# Patient Record
Sex: Female | Born: 1960
Health system: Southern US, Community
[De-identification: ages and names within clinical notes are randomized; demographics above are authoritative.]

## PROBLEM LIST (undated history)

## (undated) DIAGNOSIS — R51 Headache: Secondary | ICD-10-CM

## (undated) DIAGNOSIS — N390 Urinary tract infection, site not specified: Secondary | ICD-10-CM

## (undated) DIAGNOSIS — T7840XA Allergy, unspecified, initial encounter: Secondary | ICD-10-CM

## (undated) DIAGNOSIS — R519 Headache, unspecified: Secondary | ICD-10-CM

## (undated) DIAGNOSIS — B019 Varicella without complication: Secondary | ICD-10-CM

## (undated) DIAGNOSIS — G43909 Migraine, unspecified, not intractable, without status migrainosus: Secondary | ICD-10-CM

## (undated) HISTORY — DX: Varicella without complication: B01.9

## (undated) HISTORY — DX: Headache: R51

## (undated) HISTORY — DX: Headache, unspecified: R51.9

## (undated) HISTORY — DX: Urinary tract infection, site not specified: N39.0

## (undated) HISTORY — DX: Migraine, unspecified, not intractable, without status migrainosus: G43.909

## (undated) HISTORY — DX: Allergy, unspecified, initial encounter: T78.40XA

## (undated) NOTE — Telephone Encounter (Signed)
 Formatting of this note might be different from the original. Please call them and let them know we can do the virtual visit if they are in [place]  tomorrow. If they'd prefer a visit on 7/10 I would be able to add on a virtual visit as long as they are in state on that day. Electronically signed by Lamarr Olam HERO., CGC at 06/30/2022  2:19 PM CDT

## (undated) NOTE — Telephone Encounter (Signed)
 Formatting of this note might be different from the original. Caller would like to talk to Olam fearing or clinical  regarding appointment   Best time to call if possible: anytime   Notes: Patient son is returning call. Someone left him a message about offering appointment now until July 10th appointment for Dr Lamarr. Patient son wants know if that still stands or if they need to try and get on the next fly to [place] for appointment tomorrow. Patient son states its not going to be a guarantee that they can get on the flight tonight. Please return call to patient's son as soon as possible. Flight is tonight otherwise tomorrow morning but they will end up missing the appointment. They do prefer moving the appointment if possible to make things easier for patient. Otherwise if not, they will do what they can to make it to the appointment tomorrow.  Please assist.    Electronically signed by Diona Barry at 06/30/2022  2:12 PM CDT Electronically signed by Diona Barry at 06/30/2022  2:16 PM CDT

---
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
## 1983-01-12 HISTORY — PX: BREAST SURGERY: SHX581

## 1997-11-13 ENCOUNTER — Other Ambulatory Visit: Admission: RE | Admit: 1997-11-13 | Discharge: 1997-11-13 | Payer: Self-pay | Admitting: Obstetrics and Gynecology

## 1998-05-22 ENCOUNTER — Other Ambulatory Visit: Admission: RE | Admit: 1998-05-22 | Discharge: 1998-05-22 | Payer: Self-pay | Admitting: Obstetrics and Gynecology

## 1998-09-09 ENCOUNTER — Inpatient Hospital Stay (HOSPITAL_COMMUNITY): Admission: AD | Admit: 1998-09-09 | Discharge: 1998-09-09 | Payer: Self-pay | Admitting: *Deleted

## 1998-09-11 ENCOUNTER — Inpatient Hospital Stay (HOSPITAL_COMMUNITY): Admission: AD | Admit: 1998-09-11 | Discharge: 1998-09-11 | Payer: Self-pay | Admitting: Obstetrics & Gynecology

## 1998-12-10 ENCOUNTER — Inpatient Hospital Stay (HOSPITAL_COMMUNITY): Admission: AD | Admit: 1998-12-10 | Discharge: 1998-12-12 | Payer: Self-pay | Admitting: Orthopedic Surgery

## 1998-12-14 ENCOUNTER — Inpatient Hospital Stay (HOSPITAL_COMMUNITY): Admission: AD | Admit: 1998-12-14 | Discharge: 1998-12-16 | Payer: Self-pay | Admitting: Obstetrics & Gynecology

## 1998-12-17 ENCOUNTER — Encounter: Admission: RE | Admit: 1998-12-17 | Discharge: 1998-12-17 | Payer: Self-pay | Admitting: Obstetrics & Gynecology

## 1998-12-17 ENCOUNTER — Encounter: Payer: Self-pay | Admitting: Obstetrics & Gynecology

## 1999-01-16 ENCOUNTER — Other Ambulatory Visit: Admission: RE | Admit: 1999-01-16 | Discharge: 1999-01-16 | Payer: Self-pay | Admitting: *Deleted

## 1999-03-20 ENCOUNTER — Ambulatory Visit (HOSPITAL_COMMUNITY): Admission: RE | Admit: 1999-03-20 | Discharge: 1999-03-20 | Payer: Self-pay | Admitting: *Deleted

## 2000-02-16 ENCOUNTER — Other Ambulatory Visit: Admission: RE | Admit: 2000-02-16 | Discharge: 2000-02-16 | Payer: Self-pay | Admitting: *Deleted

## 2008-10-21 ENCOUNTER — Other Ambulatory Visit: Admission: RE | Admit: 2008-10-21 | Discharge: 2008-10-21 | Payer: Self-pay | Admitting: Internal Medicine

## 2008-12-31 ENCOUNTER — Encounter: Admission: RE | Admit: 2008-12-31 | Discharge: 2008-12-31 | Payer: Self-pay | Admitting: Internal Medicine
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
## 2010-05-29 NOTE — Op Note (Signed)
[facility]  Patient:    Deanna [name], Deanna [name]                         MRN: [id number] Proc. Date: 03/20/99 Adm. Date:  [id number] Disc. Date: [id number] Attending:  Ardeen Fillers                           Operative Report  PREOPERATIVE DIAGNOSIS:       Request for sterilization.  Multiparity.  POSTOPERATIVE DIAGNOSIS:      Request for sterilization.  Multiparity.  OPERATION:                    Laparoscopic bilateral tubal ligation.  SURGEON:                      Sung Amabile. Roslyn Smiling, M.D.  ASSISTANT:  ANESTHESIA:                   General anesthesia, endotracheal tube intubation.  ESTIMATED BLOOD LOSS:         Nil.  TUBES AND DRAINS:             None.  COMPLICATIONS:                None.  FINDINGS:                     Normal appearing uterus, tubes, and ovaries. Upper abdomen and appendix normal in appearance.  SPECIMENS:                    None.  INDICATIONS:                  A 50 year old woman, gravida 3, para 3, who requests sterilization.  She has been counseled regarding the benefits, risks, and options including vasectomy, and of the procedure.  She understands the risks of ectopic and failure (1 in 200 to 1 in 300).  DESCRIPTION OF PROCEDURE:     After the establishment of general anesthesia, the patient was placed in the dorsal lithotomy position.  The abdomen and perineum nd vagina were prepped with Betadine solution.  Graves speculum was inserted in the vagina.  The anterior cervical lip was grasped with a single tooth tenaculum. Hulka tenaculum was passed easily through the endocervical canal and attached to the cervix.  Speculum was removed.  The bladder was evacuated with straight catheterization.  The patient was draped.  A vertical subumbilical incision was made on the skin with the knife.  Veress needle was passed through this incision while the lower abdomen was elevated. Placement in the abdominal cavity was ascertained  by dropping the pressure. CO2 was insufflated.  After adequate pneumoperitoneum was established (3.5 liters), the Veress needle was removed.  Disposable 10/11 laparoscopic sleeve and trocar were inserted easily.  Placement in the abdominal cavity was ascertained. Kleppinger forceps were attached to the scope.  Attention was first drawn to the right adnexa where the right tube was visualized and followed to its fimbriated end.  It was grasped in the midportion and cauterized in that spot as well as two other points adjacent to the original cauterization site.  Laparoscopic scissors were used to transect the cauterized  section of tube.  Attention was drawn to the left adnexa where the left tube was visualized and followed to its fimbriated end.  It  was grasped in the midportion and cauterized there and at two other points directly adjacent to the original cauterization site.  Laparoscopic scissors were used to transect the tube in the cauterized area.  The tubes were inspected and noted to be hemostatic.  The upper abdomen and appendix were observed.  The laparoscope was removed.  CO2 was evacuated.  The laparoscopic sleeve was removed.  The abdomen was closed in layers.  The fascial rent was reapproximated with a figure-of-eight suture of 0 Vicryl.  The skin edges were infiltrated with 6 cc f 0.25% Marcaine.  The skin was closed with subcuticular stitch of 4-0 chromic.  The vaginal instruments were removed.  The patient was returned to the supine position, extubated without difficulty, and transported to the recovery room in  satisfactory condition. DD:  03/20/99 TD:  03/21/99 Job: [id number]

## 2010-11-24 ENCOUNTER — Other Ambulatory Visit: Payer: Self-pay | Admitting: Internal Medicine

## 2010-11-24 ENCOUNTER — Other Ambulatory Visit (HOSPITAL_COMMUNITY)
Admission: RE | Admit: 2010-11-24 | Discharge: 2010-11-24 | Disposition: A | Discharge status: Home / Self Care | Payer: 59 | Source: Ambulatory Visit | Attending: Internal Medicine | Admitting: Internal Medicine

## 2010-11-24 DIAGNOSIS — Z01419 Encounter for gynecological examination (general) (routine) without abnormal findings: Secondary | ICD-10-CM | POA: Insufficient documentation

## 2010-11-24 DIAGNOSIS — Z1159 Encounter for screening for other viral diseases: Secondary | ICD-10-CM | POA: Insufficient documentation

## 2010-11-24 DIAGNOSIS — Z1231 Encounter for screening mammogram for malignant neoplasm of breast: Secondary | ICD-10-CM

## 2010-12-18 ENCOUNTER — Ambulatory Visit
Admission: RE | Admit: 2010-12-18 | Discharge: 2010-12-18 | Disposition: A | Discharge status: Home / Self Care | Payer: 59 | Source: Ambulatory Visit | Attending: Internal Medicine | Admitting: Internal Medicine

## 2010-12-18 DIAGNOSIS — Z1231 Encounter for screening mammogram for malignant neoplasm of breast: Secondary | ICD-10-CM

## 2015-11-06 LAB — FECAL OCCULT BLOOD, GUAIAC: FECAL OCCULT BLD: NEGATIVE

## 2016-01-17 DIAGNOSIS — J018 Other acute sinusitis: Secondary | ICD-10-CM | POA: Diagnosis not present

## 2016-03-30 DIAGNOSIS — D1801 Hemangioma of skin and subcutaneous tissue: Secondary | ICD-10-CM | POA: Diagnosis not present

## 2016-03-30 DIAGNOSIS — L821 Other seborrheic keratosis: Secondary | ICD-10-CM | POA: Diagnosis not present

## 2016-03-30 DIAGNOSIS — D225 Melanocytic nevi of trunk: Secondary | ICD-10-CM | POA: Diagnosis not present

## 2016-06-14 DIAGNOSIS — T1511XA Foreign body in conjunctival sac, right eye, initial encounter: Secondary | ICD-10-CM | POA: Diagnosis not present

## 2016-08-04 DIAGNOSIS — M84374A Stress fracture, right foot, initial encounter for fracture: Secondary | ICD-10-CM | POA: Diagnosis not present

## 2016-08-04 DIAGNOSIS — M76822 Posterior tibial tendinitis, left leg: Secondary | ICD-10-CM | POA: Diagnosis not present

## 2016-08-04 DIAGNOSIS — M7732 Calcaneal spur, left foot: Secondary | ICD-10-CM | POA: Diagnosis not present

## 2016-08-04 DIAGNOSIS — M7731 Calcaneal spur, right foot: Secondary | ICD-10-CM | POA: Diagnosis not present

## 2016-08-11 DIAGNOSIS — M71572 Other bursitis, not elsewhere classified, left ankle and foot: Secondary | ICD-10-CM | POA: Diagnosis not present

## 2016-08-11 DIAGNOSIS — M722 Plantar fascial fibromatosis: Secondary | ICD-10-CM | POA: Diagnosis not present

## 2016-08-17 DIAGNOSIS — N6324 Unspecified lump in the left breast, lower inner quadrant: Secondary | ICD-10-CM | POA: Diagnosis not present

## 2016-08-17 DIAGNOSIS — N644 Mastodynia: Secondary | ICD-10-CM | POA: Diagnosis not present

## 2016-08-20 DIAGNOSIS — N644 Mastodynia: Secondary | ICD-10-CM | POA: Diagnosis not present

## 2016-08-25 DIAGNOSIS — M84374D Stress fracture, right foot, subsequent encounter for fracture with routine healing: Secondary | ICD-10-CM | POA: Diagnosis not present

## 2016-08-25 DIAGNOSIS — M722 Plantar fascial fibromatosis: Secondary | ICD-10-CM | POA: Diagnosis not present

## 2016-08-25 DIAGNOSIS — M71572 Other bursitis, not elsewhere classified, left ankle and foot: Secondary | ICD-10-CM | POA: Diagnosis not present

## 2016-09-07 DIAGNOSIS — G43909 Migraine, unspecified, not intractable, without status migrainosus: Secondary | ICD-10-CM | POA: Diagnosis not present

## 2016-09-07 DIAGNOSIS — Z01419 Encounter for gynecological examination (general) (routine) without abnormal findings: Secondary | ICD-10-CM | POA: Diagnosis not present

## 2016-09-08 DIAGNOSIS — M84374D Stress fracture, right foot, subsequent encounter for fracture with routine healing: Secondary | ICD-10-CM | POA: Diagnosis not present

## 2016-10-13 DIAGNOSIS — L570 Actinic keratosis: Secondary | ICD-10-CM | POA: Diagnosis not present

## 2016-10-13 DIAGNOSIS — D2272 Melanocytic nevi of left lower limb, including hip: Secondary | ICD-10-CM | POA: Diagnosis not present

## 2016-10-13 DIAGNOSIS — L738 Other specified follicular disorders: Secondary | ICD-10-CM | POA: Diagnosis not present

## 2016-11-17 DIAGNOSIS — E559 Vitamin D deficiency, unspecified: Secondary | ICD-10-CM | POA: Diagnosis not present

## 2016-11-17 DIAGNOSIS — T148XXA Other injury of unspecified body region, initial encounter: Secondary | ICD-10-CM | POA: Diagnosis not present

## 2016-11-24 DIAGNOSIS — M84374D Stress fracture, right foot, subsequent encounter for fracture with routine healing: Secondary | ICD-10-CM | POA: Diagnosis not present

## 2016-11-24 DIAGNOSIS — E559 Vitamin D deficiency, unspecified: Secondary | ICD-10-CM | POA: Diagnosis not present

## 2016-12-08 DIAGNOSIS — T148XXA Other injury of unspecified body region, initial encounter: Secondary | ICD-10-CM | POA: Diagnosis not present

## 2016-12-22 DIAGNOSIS — T148XXA Other injury of unspecified body region, initial encounter: Secondary | ICD-10-CM | POA: Diagnosis not present

## 2017-03-24 DIAGNOSIS — Z13220 Encounter for screening for lipoid disorders: Secondary | ICD-10-CM | POA: Diagnosis not present

## 2017-03-24 DIAGNOSIS — E559 Vitamin D deficiency, unspecified: Secondary | ICD-10-CM | POA: Diagnosis not present

## 2017-03-31 DIAGNOSIS — L821 Other seborrheic keratosis: Secondary | ICD-10-CM | POA: Diagnosis not present

## 2017-03-31 DIAGNOSIS — D225 Melanocytic nevi of trunk: Secondary | ICD-10-CM | POA: Diagnosis not present

## 2017-03-31 DIAGNOSIS — L820 Inflamed seborrheic keratosis: Secondary | ICD-10-CM | POA: Diagnosis not present

## 2017-08-29 DIAGNOSIS — Z1231 Encounter for screening mammogram for malignant neoplasm of breast: Secondary | ICD-10-CM | POA: Diagnosis not present

## 2017-09-16 ENCOUNTER — Other Ambulatory Visit: Payer: Self-pay

## 2017-09-16 ENCOUNTER — Ambulatory Visit (INDEPENDENT_AMBULATORY_CARE_PROVIDER_SITE_OTHER): Payer: 59 | Admitting: Family Medicine

## 2017-09-16 ENCOUNTER — Encounter: Payer: Self-pay | Admitting: Family Medicine

## 2017-09-16 VITALS — BP 126/86 | HR 56 | Temp 97.9°F | Resp 16 | Ht 65.5 in | Wt 195.2 lb

## 2017-09-16 DIAGNOSIS — Z0000 Encounter for general adult medical examination without abnormal findings: Secondary | ICD-10-CM | POA: Diagnosis not present

## 2017-09-16 DIAGNOSIS — E669 Obesity, unspecified: Secondary | ICD-10-CM | POA: Insufficient documentation

## 2017-09-16 DIAGNOSIS — Z1211 Encounter for screening for malignant neoplasm of colon: Secondary | ICD-10-CM | POA: Diagnosis not present

## 2017-09-16 LAB — HEPATIC FUNCTION PANEL
ALT: 22 U/L (ref 0–35)
AST: 15 U/L (ref 0–37)
Albumin: 4.6 g/dL (ref 3.5–5.2)
Alkaline Phosphatase: 93 U/L (ref 39–117)
BILIRUBIN TOTAL: 0.5 mg/dL (ref 0.2–1.2)
Bilirubin, Direct: 0.1 mg/dL (ref 0.0–0.3)
TOTAL PROTEIN: 7.1 g/dL (ref 6.0–8.3)

## 2017-09-16 LAB — CBC WITH DIFFERENTIAL/PLATELET
BASOS ABS: 0 10*3/uL (ref 0.0–0.1)
BASOS PCT: 0.8 % (ref 0.0–3.0)
EOS ABS: 0.2 10*3/uL (ref 0.0–0.7)
Eosinophils Relative: 2.7 % (ref 0.0–5.0)
HCT: 43.8 % (ref 36.0–46.0)
HEMOGLOBIN: 14.5 g/dL (ref 12.0–15.0)
Lymphocytes Relative: 40.3 % (ref 12.0–46.0)
Lymphs Abs: 2.6 10*3/uL (ref 0.7–4.0)
MCHC: 33.2 g/dL (ref 30.0–36.0)
MCV: 89.9 fl (ref 78.0–100.0)
MONO ABS: 0.4 10*3/uL (ref 0.1–1.0)
Monocytes Relative: 6.6 % (ref 3.0–12.0)
Neutro Abs: 3.2 10*3/uL (ref 1.4–7.7)
Neutrophils Relative %: 49.6 % (ref 43.0–77.0)
Platelets: 279 10*3/uL (ref 150.0–400.0)
RBC: 4.87 Mil/uL (ref 3.87–5.11)
RDW: 13.2 % (ref 11.5–15.5)
WBC: 6.4 10*3/uL (ref 4.0–10.5)

## 2017-09-16 LAB — LIPID PANEL
CHOLESTEROL: 184 mg/dL (ref 0–200)
HDL: 54.3 mg/dL (ref >39.00)
LDL Cholesterol: 109 mg/dL — ABNORMAL HIGH (ref 0–99)
NONHDL: 129.84
TRIGLYCERIDES: 106 mg/dL (ref 0.0–149.0)
Total CHOL/HDL Ratio: 3
VLDL: 21.2 mg/dL (ref 0.0–40.0)

## 2017-09-16 LAB — TSH: TSH: 2.04 u[IU]/mL (ref 0.35–4.50)

## 2017-09-16 LAB — BASIC METABOLIC PANEL
BUN: 16 mg/dL (ref 6–23)
CHLORIDE: 105 meq/L (ref 96–112)
CO2: 31 meq/L (ref 19–32)
CREATININE: 0.8 mg/dL (ref 0.40–1.20)
Calcium: 9.8 mg/dL (ref 8.4–10.5)
GFR: 78.8 mL/min (ref >60.00)
GLUCOSE: 89 mg/dL (ref 70–99)
Potassium: 4.4 mEq/L (ref 3.5–5.1)
Sodium: 143 mEq/L (ref 135–145)

## 2017-09-16 LAB — VITAMIN D 25 HYDROXY (VIT D DEFICIENCY, FRACTURES): VITD: 42.04 ng/mL (ref 30.00–100.00)

## 2017-09-16 NOTE — Assessment & Plan Note (Signed)
Pt's PE WNL w/ exception of weight.  Will do cologuard for colon cancer screen.  Check labs.  Anticipatory guidance provided.

## 2017-09-16 NOTE — Progress Notes (Signed)
   Subjective:    Patient ID: Deanna [name], female    DOB: 11-16-1960, 57 y.o.   MRN: [id number]  HPI New to establish.  Previous MD- Dr Maxwell Caul (but not recently)  GYN- [facility]  Colon cancer screening- pt has been doing FOBT yearly w/ good results.  Father had serious complications (coded) during colonoscopy so she understandably is fearful.  Pt prefers cologuard.  Obesity- pt is exercising regularly (daily) and eating well.  Frustrated by lack of weight loss due to menopause.   Review of Systems Patient reports no vision/ hearing changes, adenopathy,fever, weight change,  persistant/recurrent hoarseness , swallowing issues, chest pain, palpitations, edema, persistant/recurrent cough, hemoptysis, dyspnea (rest/exertional/paroxysmal nocturnal), gastrointestinal bleeding (melena, rectal bleeding), abdominal pain, significant heartburn, bowel changes, GU symptoms (dysuria, hematuria, incontinence), Gyn symptoms (abnormal  bleeding, pain),  syncope, focal weakness, memory loss, numbness & tingling, skin/hair/nail changes, abnormal bruising or bleeding, anxiety, or depression.     Objective:   Physical Exam General Appearance:    Alert, cooperative, no distress, appears stated age  Head:    Normocephalic, without obvious abnormality, atraumatic  Eyes:    PERRL, conjunctiva/corneas clear, EOM's intact, fundi    benign, both eyes  Ears:    Normal TM's and external ear canals, both ears  Nose:   Nares normal, septum midline, mucosa normal, no drainage    or sinus tenderness  Throat:   Lips, mucosa, and tongue normal; teeth and gums normal  Neck:   Supple, symmetrical, trachea midline, no adenopathy;    Thyroid: no enlargement/tenderness/nodules  Back:     Symmetric, no curvature, ROM normal, no CVA tenderness  Lungs:     Clear to auscultation bilaterally, respirations unlabored  Chest Wall:    No tenderness or deformity   Heart:    Regular rate and rhythm, S1 and S2 normal, no murmur, rub   or  gallop  Breast Exam:    Deferred to GYN  Abdomen:     Soft, non-tender, bowel sounds active all four quadrants,    no masses, no organomegaly  Genitalia:    Deferred to GYN  Rectal:    Extremities:   Extremities normal, atraumatic, no cyanosis or edema  Pulses:   2+ and symmetric all extremities  Skin:   Skin color, texture, turgor normal, no rashes or lesions  Lymph nodes:   Cervical, supraclavicular, and axillary nodes normal  Neurologic:   CNII-XII intact, normal strength, sensation and reflexes    throughout          Assessment & Plan:

## 2017-09-16 NOTE — Patient Instructions (Signed)
Follow up in 1 year or as needed We'll notify you of your lab results and make any changes if needed Keep up the good work on healthy diet and regular exercise- you look great! Call with any questions or concerns Welcome!  We're glad to have you!!!

## 2017-09-16 NOTE — Assessment & Plan Note (Signed)
New to provider, ongoing for pt.  Applauded her efforts at healthy diet and regular exercise.  Check labs to risk stratify.  Will follow.

## 2017-09-21 DIAGNOSIS — Z6830 Body mass index (BMI) 30.0-30.9, adult: Secondary | ICD-10-CM | POA: Diagnosis not present

## 2017-09-21 DIAGNOSIS — Z01419 Encounter for gynecological examination (general) (routine) without abnormal findings: Secondary | ICD-10-CM | POA: Diagnosis not present

## 2017-09-22 DIAGNOSIS — Z1212 Encounter for screening for malignant neoplasm of rectum: Secondary | ICD-10-CM | POA: Diagnosis not present

## 2017-09-22 DIAGNOSIS — Z1211 Encounter for screening for malignant neoplasm of colon: Secondary | ICD-10-CM | POA: Diagnosis not present

## 2017-09-23 LAB — COLOGUARD: Cologuard: NEGATIVE

## 2017-09-29 ENCOUNTER — Encounter: Payer: Self-pay | Admitting: General Practice

## 2017-10-12 DIAGNOSIS — M722 Plantar fascial fibromatosis: Secondary | ICD-10-CM | POA: Diagnosis not present

## 2017-10-12 DIAGNOSIS — M7731 Calcaneal spur, right foot: Secondary | ICD-10-CM | POA: Diagnosis not present

## 2017-10-18 ENCOUNTER — Encounter: Payer: Self-pay | Admitting: General Practice

## 2017-10-31 ENCOUNTER — Ambulatory Visit (INDEPENDENT_AMBULATORY_CARE_PROVIDER_SITE_OTHER): Payer: 59

## 2017-10-31 ENCOUNTER — Encounter: Payer: Self-pay | Admitting: Podiatry

## 2017-10-31 ENCOUNTER — Ambulatory Visit: Payer: 59 | Admitting: Podiatry

## 2017-10-31 VITALS — BP 136/81 | HR 70 | Resp 16

## 2017-10-31 DIAGNOSIS — M722 Plantar fascial fibromatosis: Secondary | ICD-10-CM | POA: Diagnosis not present

## 2017-10-31 MED ORDER — DICLOFENAC SODIUM 75 MG PO TBEC: 75.0000 mg | DELAYED_RELEASE_TABLET | Freq: Two times a day (BID) | ORAL | 2 refills | Status: DC

## 2017-10-31 MED ORDER — TRIAMCINOLONE ACETONIDE 10 MG/ML IJ SUSP
10.0000 mg | Freq: Once | INTRAMUSCULAR | Status: AC
  Administered 2017-10-31: 10 mg

## 2017-10-31 NOTE — Patient Instructions (Signed)
For instructions on how to put on your Plantar Fascial Brace, please visit [email or web address]   Plantar Fasciitis (Heel Spur Syndrome) with Rehab The plantar fascia is a fibrous, ligament-like, soft-tissue structure that spans the bottom of the foot. Plantar fasciitis is a condition that causes pain in the foot due to inflammation of the tissue. SYMPTOMS   Pain and tenderness on the underneath side of the foot.  Pain that worsens with standing or walking. CAUSES  Plantar fasciitis is caused by irritation and injury to the plantar fascia on the underneath side of the foot. Common mechanisms of injury include:  Direct trauma to bottom of the foot.  Damage to a small nerve that runs under the foot where the main fascia attaches to the heel bone.  Stress placed on the plantar fascia due to bone spurs. RISK INCREASES WITH:   Activities that place stress on the plantar fascia (running, jumping, pivoting, or cutting).  Poor strength and flexibility.  Improperly fitted shoes.  Tight calf muscles.  Flat feet.  Failure to warm-up properly before activity.  Obesity. PREVENTION  Warm up and stretch properly before activity.  Allow for adequate recovery between workouts.  Maintain physical fitness:  Strength, flexibility, and endurance.  Cardiovascular fitness.  Maintain a health body weight.  Avoid stress on the plantar fascia.  Wear properly fitted shoes, including arch supports for individuals who have flat feet.  PROGNOSIS  If treated properly, then the symptoms of plantar fasciitis usually resolve without surgery. However, occasionally surgery is necessary.  RELATED COMPLICATIONS   Recurrent symptoms that may result in a chronic condition.  Problems of the lower back that are caused by compensating for the injury, such as limping.  Pain or weakness of the foot during push-off following surgery.  Chronic inflammation, scarring, and partial or complete  fascia tear, occurring more often from repeated injections.  TREATMENT  Treatment initially involves the use of ice and medication to help reduce pain and inflammation. The use of strengthening and stretching exercises may help reduce pain with activity, especially stretches of the Achilles tendon. These exercises may be performed at home or with a therapist. Your caregiver may recommend that you use heel cups of arch supports to help reduce stress on the plantar fascia. Occasionally, corticosteroid injections are given to reduce inflammation. If symptoms persist for greater than 6 months despite non-surgical (conservative), then surgery may be recommended.   MEDICATION   If pain medication is necessary, then nonsteroidal anti-inflammatory medications, such as aspirin and ibuprofen, or other minor pain relievers, such as acetaminophen, are often recommended.  Do not take pain medication within 7 days before surgery.  Prescription pain relievers may be given if deemed necessary by your caregiver. Use only as directed and only as much as you need.  Corticosteroid injections may be given by your caregiver. These injections should be reserved for the most serious cases, because they may only be given a certain number of times.  HEAT AND COLD  Cold treatment (icing) relieves pain and reduces inflammation. Cold treatment should be applied for 10 to 15 minutes every 2 to 3 hours for inflammation and pain and immediately after any activity that aggravates your symptoms. Use ice packs or massage the area with a piece of ice (ice massage).  Heat treatment may be used prior to performing the stretching and strengthening activities prescribed by your caregiver, physical therapist, or athletic trainer. Use a heat pack or soak the injury in warm water.  SEEK IMMEDIATE MEDICAL   CARE IF:  Treatment seems to offer no benefit, or the condition worsens.  Any medications produce adverse side effects.   EXERCISES- RANGE OF MOTION (ROM) AND STRETCHING EXERCISES - Plantar Fasciitis (Heel Spur Syndrome) These exercises may help you when beginning to rehabilitate your injury. Your symptoms may resolve with or without further involvement from your physician, physical therapist or athletic trainer. While completing these exercises, remember:   Restoring tissue flexibility helps normal motion to return to the joints. This allows healthier, less painful movement and activity.  An effective stretch should be held for at least 30 seconds.  A stretch should never be painful. You should only feel a gentle lengthening or release in the stretched tissue.  RANGE OF MOTION - Toe Extension, Flexion  Sit with your right / left leg crossed over your opposite knee.  Grasp your toes and gently pull them back toward the top of your foot. You should feel a stretch on the bottom of your toes and/or foot.  Hold this stretch for 10 seconds.  Now, gently pull your toes toward the bottom of your foot. You should feel a stretch on the top of your toes and or foot.  Hold this stretch for 10 seconds. Repeat  times. Complete this stretch 3 times per day.   RANGE OF MOTION - Ankle Dorsiflexion, Active Assisted  Remove shoes and sit on a chair that is preferably not on a carpeted surface.  Place right / left foot under knee. Extend your opposite leg for support.  Keeping your heel down, slide your right / left foot back toward the chair until you feel a stretch at your ankle or calf. If you do not feel a stretch, slide your bottom forward to the edge of the chair, while still keeping your heel down.  Hold this stretch for 10 seconds. Repeat 3 times. Complete this stretch 2 times per day.   STRETCH  Gastroc, Standing  Place hands on wall.  Extend right / left leg, keeping the front knee somewhat bent.  Slightly point your toes inward on your back foot.  Keeping your right / left heel on the floor and your  knee straight, shift your weight toward the wall, not allowing your back to arch.  You should feel a gentle stretch in the right / left calf. Hold this position for 10 seconds. Repeat 3 times. Complete this stretch 2 times per day.  STRETCH  Soleus, Standing  Place hands on wall.  Extend right / left leg, keeping the other knee somewhat bent.  Slightly point your toes inward on your back foot.  Keep your right / left heel on the floor, bend your back knee, and slightly shift your weight over the back leg so that you feel a gentle stretch deep in your back calf.  Hold this position for 10 seconds. Repeat 3 times. Complete this stretch 2 times per day.  STRETCH  Gastrocsoleus, Standing  Note: This exercise can place a lot of stress on your foot and ankle. Please complete this exercise only if specifically instructed by your caregiver.   Place the ball of your right / left foot on a step, keeping your other foot firmly on the same step.  Hold on to the wall or a rail for balance.  Slowly lift your other foot, allowing your body weight to press your heel down over the edge of the step.  You should feel a stretch in your right / left calf.  Hold this   position for 10 seconds.  Repeat this exercise with a slight bend in your right / left knee. Repeat 3 times. Complete this stretch 2 times per day.   STRENGTHENING EXERCISES - Plantar Fasciitis (Heel Spur Syndrome)  These exercises may help you when beginning to rehabilitate your injury. They may resolve your symptoms with or without further involvement from your physician, physical therapist or athletic trainer. While completing these exercises, remember:   Muscles can gain both the endurance and the strength needed for everyday activities through controlled exercises.  Complete these exercises as instructed by your physician, physical therapist or athletic trainer. Progress the resistance and repetitions only as guided.  STRENGTH -  Towel Curls  Sit in a chair positioned on a non-carpeted surface.  Place your foot on a towel, keeping your heel on the floor.  Pull the towel toward your heel by only curling your toes. Keep your heel on the floor. Repeat 3 times. Complete this exercise 2 times per day.  STRENGTH - Ankle Inversion  Secure one end of a rubber exercise band/tubing to a fixed object (table, pole). Loop the other end around your foot just before your toes.  Place your fists between your knees. This will focus your strengthening at your ankle.  Slowly, pull your big toe up and in, making sure the band/tubing is positioned to resist the entire motion.  Hold this position for 10 seconds.  Have your muscles resist the band/tubing as it slowly pulls your foot back to the starting position. Repeat 3 times. Complete this exercises 2 times per day.  Document Released: 12/28/2004 Document Revised: 03/22/2011 Document Reviewed: 04/11/2008 [employer] Patient Information 2014 [employer]. 

## 2017-11-03 NOTE — Progress Notes (Signed)
Subjective:   Patient ID: Deanna [name], female   DOB: 57 y.o.   MRN: [id number]   HPI Patient states having a lot of pain in the heel left over right and had some previous treatments but is never truly had it treated in an aggressive fashion.  States she is interested in shockwave one point future but never has had true active treatment for her condition and states is worse when she gets up in the morning after sitting.  Patient does not smoke likes to be active   Review of Systems  All other systems reviewed and are negative.       Objective:  Physical Exam  Constitutional: She appears well-developed and well-nourished.  Cardiovascular: Intact distal pulses.  Pulmonary/Chest: Effort normal.  Musculoskeletal: Normal range of motion.  Neurological: She is alert.  Skin: Skin is warm.  Nursing note and vitals reviewed.   Neurovascular status found to be intact muscle strength is adequate range of motion within normal limits with exquisite discomfort plantar aspect heel left foot with mild discomfort in the right.  There is inflammation fluid around the medial band and moderate depression of the arch is noted also.  Patient has good digital perfusion well oriented x3     Assessment:  Acute plantar fasciitis heel left over right with inflammation fluid of the medial band     Plan:  H&P condition reviewed and at this point I went ahead and I injected the left fascia 3 mg Kenalog 5 mg Xylocaine applied fascial brace and gave instructions on physical therapy.  Given instructions for lifting the arch and possible orthotics long-term and discussed possible shockwave depending on response  X-rays indicate spur with no indication stress fracture or advanced arthritis

## 2017-11-04 ENCOUNTER — Telehealth: Payer: Self-pay | Admitting: *Deleted

## 2017-11-04 ENCOUNTER — Encounter: Payer: Self-pay | Admitting: Podiatry

## 2017-11-04 NOTE — Telephone Encounter (Signed)
Pt informed Dr. Paulla Dolly of reaction to Diclofenac.

## 2017-11-04 NOTE — Telephone Encounter (Signed)
Entered in error

## 2017-11-14 ENCOUNTER — Ambulatory Visit: Payer: 59 | Admitting: Podiatry

## 2017-11-14 ENCOUNTER — Encounter: Payer: Self-pay | Admitting: Podiatry

## 2017-11-14 DIAGNOSIS — M722 Plantar fascial fibromatosis: Secondary | ICD-10-CM

## 2017-11-15 NOTE — Progress Notes (Signed)
Subjective:   Patient ID: Deanna [name], female   DOB: 57 y.o.   MRN: [id number]   HPI Patient states that the right heel is improved a lot but there is still more discomfort in the left heel with ambulation and when getting up in the morning patient does not feel that she has enough arch support   ROS      Objective:  Physical Exam  Neurovascular status intact patient found to have discomfort in the plantar left heel and arch with fluid buildup noted around the medial calcaneus with minimal discomfort on the right     Assessment:  Moderate depression of the arch with chronic inflammatory condition heel left over right     Plan:  H&P condition reviewed treatment options discussed along with physical therapy supportive shoes.  I did go ahead today and I casted for functional orthotics to reduce the stress on the heel.  Had a orthotist to dispense

## 2017-12-05 ENCOUNTER — Ambulatory Visit (INDEPENDENT_AMBULATORY_CARE_PROVIDER_SITE_OTHER): Payer: 59

## 2017-12-05 ENCOUNTER — Encounter: Payer: Self-pay | Admitting: Sports Medicine

## 2017-12-05 ENCOUNTER — Ambulatory Visit: Payer: Self-pay

## 2017-12-05 ENCOUNTER — Ambulatory Visit: Payer: 59 | Admitting: Sports Medicine

## 2017-12-05 VITALS — BP 128/84 | HR 71 | Ht 66.0 in | Wt 200.4 lb

## 2017-12-05 DIAGNOSIS — M25561 Pain in right knee: Secondary | ICD-10-CM

## 2017-12-05 DIAGNOSIS — S83411A Sprain of medial collateral ligament of right knee, initial encounter: Secondary | ICD-10-CM | POA: Diagnosis not present

## 2017-12-05 DIAGNOSIS — S8991XA Unspecified injury of right lower leg, initial encounter: Secondary | ICD-10-CM | POA: Diagnosis not present

## 2017-12-05 MED ORDER — IBUPROFEN-FAMOTIDINE 800-26.6 MG PO TABS: 1.0000 | ORAL_TABLET | Freq: Three times a day (TID) | ORAL | 0 refills | Status: AC | PRN

## 2017-12-05 MED ORDER — IBUPROFEN-FAMOTIDINE 800-26.6 MG PO TABS: 1.0000 | ORAL_TABLET | Freq: Three times a day (TID) | ORAL | 2 refills | Status: DC | PRN

## 2017-12-05 NOTE — Patient Instructions (Addendum)
Please perform the exercise program that we have prepared for you and gone over in detail on a daily basis.  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  You will receive a phone call or text from one of the participating pharmacies which can be located in any state in the [place].  You must communicate directly with them to have this medication filled.  When the pharmacy contacts you, they will need your mailing address (for shipment of the medication) andy they will need payment information if you have a copay (typically no more than $10). If you have not heard from them 2-3 days after your appointment with Dr. Paulla Fore, contact [facility] directly at [phone].

## 2017-12-05 NOTE — Procedures (Signed)
LIMITED MSK ULTRASOUND OF Right knee Images were obtained and interpreted by myself, Teresa Coombs, DO  Images have been saved and stored to PACS system. Images obtained on: GE S7 Ultrasound machine  FINDINGS:   MCL has overlying soft tissue edema as well as a focal area.  Mild degenerative bulging possible interstitial split however no increased neovascularity and no significant effusion.   IMPRESSION:  1. Grade 1 MCL strain

## 2017-12-05 NOTE — Progress Notes (Signed)
Deanna [name]. Deanna [name], [place] at [place] - 57 y.o. female MRN [id number]  Date of birth: 1960-12-19  Visit Date: 12/05/2017  PCP: Midge Minium, MD   Referred by: Midge Minium, MD   Scribe(s) for today's visit: Josepha Pigg, CMA  SUBJECTIVE:  Deanna [name] is here for Initial Assessment (R knee pain)   HPI: 12/05/2017: Her R knee pain symptoms INITIALLY: Began yesterday, she fell while sleep walking. Her foot hit the rug and she tripped and fell.  Described as moderate pinching and aching, nonradiating Worsened with flexion and extension Improved with keeping the knee in 1 position.  Additional associated symptoms include: Pain is mostly medial. She denies bruising and swelling around the knee. She denies clicking, popping, catching, giving out. She denies past injury to the R knee but does note having bursa drained about 10 yrs ago. She also notes hx of plantar fasciitis, she received steroid injection about 1 month ago.     At this time symptoms show no change compared to onset. Sx were more severe last night but this morning pain is about the same as it was at the time of injury.  She has been taking Aleve and wearing compression sleeve with some relief. She has also been icing and elevating the knee.   REVIEW OF SYSTEMS: Reports night time disturbances. Denies fevers, chills, or night sweats. Denies unexplained weight loss. Denies personal history of cancer. Denies changes in bowel or bladder habits. Reports recent unreported falls. Denies new or worsening dyspnea or wheezing. Reports headaches, hx of migraine.  Denies numbness, tingling or weakness in the extremities.  Denies dizziness or presyncopal episodes Denies lower extremity edema    HISTORY:  Prior history reviewed and updated per electronic medical record.  Social History   Occupational History  . Not on file  Tobacco  Use  . Smoking status: Never Smoker  . Smokeless tobacco: Never Used  Substance and Sexual Activity  . Alcohol use: Yes    Alcohol/week: 1.0 standard drinks    Types: 1 Glasses of wine per week    Comment: Per Week  . Drug use: Never  . Sexual activity: Yes   Social History   Social History Narrative  . Not on file    Past Medical History:  Diagnosis Date  . Allergy   . Chicken pox   . Frequent headaches   . Migraine   . Urinary tract infection    Past Surgical History:  Procedure Laterality Date  . BREAST SURGERY  1985  . TONSILLECTOMY  1975   family history includes Arthritis in her mother; Asthma in her mother; Cancer in her paternal grandfather; Early death in her maternal grandfather; Hearing loss in her father; Heart Problems in her father; Heart attack in her maternal grandmother and paternal grandmother; Heart disease in her maternal grandmother and paternal grandmother; Hyperlipidemia in her father, maternal grandmother, mother, and paternal grandmother; Hypertension in her father, maternal grandmother, and paternal grandmother; Macular degeneration in her father; Stroke in her paternal grandfather.  DATA OBTAINED & REVIEWED:  No results for input(s): HGBA1C, LABURIC, CREATINE in the last 8760 hours. Problem  Acute Pain of Right Knee   12/05/2017: X-ray right knee: Negative, MSK ultrasound shows grade 1 MCL strain. Reaction knee brace     .   OBJECTIVE:  VS:  HT:5\' 6"  (167.6 cm)   WT:200 lb 6.4 oz (90.9 kg)  BMI:32.36    BP:128/84  HR:71bpm  TEMP: ( )  RESP:97 %   PHYSICAL EXAM: CONSTITUTIONAL: Well-developed, Well-nourished and In no acute distress PSYCHIATRIC: Alert & appropriately interactive. and Not depressed or anxious appearing. RESPIRATORY: No increased work of breathing and Trachea Midline EYES: Pupils are equal., EOM intact without nystagmus. and No scleral icterus.  VASCULAR EXAM: Warm and well perfused NEURO: unremarkable  MSK  Exam: Right knee  Well aligned, no significant deformity. No overlying skin changes. No focal bony tenderness   RANGE OF MOTION & STRENGTH  Somewhat limited full knee extension with mild pain with terminal extension and terminal knee flexion.  Localizing to the medial joint line.   SPECIALITY TESTING:  Ligamentously stable to varus and valgus stressing at 0 degrees and 30 degrees however painful most focally at 0 degrees.  Localizing to the medial joint line.  No significant pain with McMurray's or Thessaly.  Anterior posterior drawer stable with a normal Lockman's.    ASSESSMENT   1. Acute pain of right knee   2. Sprain of medial collateral ligament of right knee, initial encounter     PLAN:  Pertinent additional documentation may be included in corresponding procedure notes, imaging studies, problem based documentation and patient instructions.  Procedures:  . Discussed the foundation of treatment for this condition is physical therapy and/or daily (5-6 days/week) therapeutic exercises, focusing on core strengthening, coordination, neuromuscular control/reeducation.  Therapeutic exercises prescribed per procedure note.  Medications:  Meds ordered this encounter  Medications  . Ibuprofen-Famotidine (DUEXIS) 800-26.6 MG TABS    Sig: Take 1 tablet by mouth 3 (three) times daily as needed. 1 tab po tid X 14 days then 1 tab po tid as needed    Dispense:  90 tablet    Refill:  2    Home Phone      [phone] Work Phone      [phone] Mobile          [phone]   . Ibuprofen-Famotidine (DUEXIS) 800-26.6 MG TABS    Sig: Take 1 tablet by mouth 3 (three) times daily as needed for up to 1 day.    Dispense:  9 tablet    Refill:  0   Discussion/Instructions: Acute pain of right knee Grade 1 MCL strain based on MSK ultrasound and physical exam findings. Systemic NSAID Reaction knee brace  . Brace/supportive device provided per AVS. Reaction knee brace . Discussed red flag  symptoms that warrant earlier emergent evaluation and patient voices understanding. . Activity modifications and the importance of avoiding exacerbating activities (limiting pain to no more than a 4 / 10 during or following activity) recommended and discussed.  Follow-up:  . Return in about 2 weeks (around 12/19/2017).  . If any lack of improvement: consider further diagnostic evaluation with MRI of the knee . At follow up will plan: to consider initial corticosteroid injections     CMA/ATC served as scribe during this visit. History, Physical, and Plan performed by medical provider. Documentation and orders reviewed and attested to.      Gerda Diss, [facility] Physician

## 2017-12-05 NOTE — Progress Notes (Signed)
PROCEDURE NOTE: THERAPEUTIC EXERCISES (97110) 15 minutes spent for Therapeutic exercises as below and as referenced in the AVS.  This included exercises focusing on stretching, strengthening, with significant focus on eccentric aspects.   Proper technique shown and discussed handout in great detail with ATC.  All questions were discussed and answered.   Long term goals include an improvement in range of motion, strength, endurance as well as avoiding reinjury. Frequency of visits is one time as determined during today's  office visit. Frequency of exercises to be performed is as per handout.  EXERCISES REVIEWED: Hip ABduction strengthening with focus on Glute Medius Recruitment VMO Strengthening

## 2017-12-10 ENCOUNTER — Encounter: Payer: Self-pay | Admitting: Sports Medicine

## 2017-12-10 DIAGNOSIS — M25561 Pain in right knee: Secondary | ICD-10-CM | POA: Insufficient documentation

## 2017-12-10 NOTE — Assessment & Plan Note (Addendum)
Grade 1 MCL strain based on MSK ultrasound and physical exam findings. Systemic NSAID Reaction knee brace

## 2017-12-12 ENCOUNTER — Encounter: Payer: 59 | Admitting: Orthotics

## 2017-12-16 ENCOUNTER — Ambulatory Visit: Payer: 59 | Admitting: Orthotics

## 2017-12-16 DIAGNOSIS — M25561 Pain in right knee: Secondary | ICD-10-CM

## 2017-12-16 DIAGNOSIS — M722 Plantar fascial fibromatosis: Secondary | ICD-10-CM

## 2017-12-16 NOTE — Progress Notes (Signed)
Patient came in today to pick up custom made foot orthotics.  The goals were accomplished and the patient reported no dissatisfaction with said orthotics.  Patient was advised of breakin period and how to report any issues. 

## 2017-12-20 ENCOUNTER — Encounter: Payer: Self-pay | Admitting: Sports Medicine

## 2017-12-20 ENCOUNTER — Ambulatory Visit: Payer: 59 | Admitting: Sports Medicine

## 2017-12-20 VITALS — BP 130/82 | HR 76 | Ht 66.0 in | Wt 199.6 lb

## 2017-12-20 DIAGNOSIS — S83411A Sprain of medial collateral ligament of right knee, initial encounter: Secondary | ICD-10-CM

## 2017-12-20 DIAGNOSIS — M25561 Pain in right knee: Secondary | ICD-10-CM | POA: Diagnosis not present

## 2017-12-20 MED ORDER — DICLOFENAC SODIUM 2 % TD SOLN: 1.0000 "application " | Freq: Two times a day (BID) | TRANSDERMAL | 2 refills | Status: DC

## 2017-12-20 MED ORDER — DICLOFENAC SODIUM 2 % TD SOLN: 1.0000 "application " | Freq: Two times a day (BID) | TRANSDERMAL | 0 refills | Status: AC

## 2017-12-20 NOTE — Progress Notes (Addendum)
Deanna [name]. Deanna [name], [place] at [place] - 57 y.o. female MRN [id number]  Date of birth: [birth year]  Visit Date:   PCP: Midge Minium, MD   Referred by: Midge Minium, MD   SUBJECTIVE:  Chief Complaint  Patient presents with  . Follow-up    R knee pain    HPI: Overall she reports slight improvement in her right medial knee pain.  She did have intolerance to the [facility] and has discontinued this.  She continues using ice elevation and the Tru pull brace provided at last visit.  She has tried taking Tylenol with mild improvement.  She is doing VMO and hip abduction strengthening exercises daily.  REVIEW OF SYSTEMS: She is continued to have minimal nighttime disturbances especially when she rolls directly over the side as well as with the Duexis. She does occasionally get migraines and had 1 over the past week because worsening acid reflux-like symptoms but otherwise at this time doing well.  HISTORY:  Prior history reviewed and updated per electronic medical record.  Social History   Occupational History  . Not on file  Tobacco Use  . Smoking status: Never Smoker  . Smokeless tobacco: Never Used  Substance and Sexual Activity  . Alcohol use: Yes    Alcohol/week: 1.0 standard drinks    Types: 1 Glasses of wine per week    Comment: Per Week  . Drug use: Never  . Sexual activity: Yes   Social History   Social History Narrative  . Not on file    DATA OBTAINED & REVIEWED:   Recent Labs    09/16/17 1055  CALCIUM 9.8  AST 15  ALT 22  TSH 2.04   No problems updated. No specialty comments available.  OBJECTIVE:  VS:  HT:5\' 6"  (167.6 cm)   WT:199 lb 9.6 oz (90.5 kg)  BMI:32.23    BP:130/82  HR:76bpm  TEMP: ( )  RESP:97 %   PHYSICAL EXAM: CONSTITUTIONAL: Well-developed, Well-nourished and In no acute distress PSYCHIATRIC : Alert & appropriately interactive. and Not depressed  or anxious appearing. RESPIRATORY : No increased work of breathing and Trachea Midline EYES : Pupils are equal., EOM intact without nystagmus. and No scleral icterus.  VASCULAR EXAM : Warm and well perfused NEURO: unremarkable  MSK Exam:  Right knee  Well aligned No significant deformity. No overlying skin changes TTP over: Medial joint line.  No significant pain over the lateral knee or patellar tendon. No swelling No effusion Mild synovitis Knee he does have slight laxity with valgus stressing causes a moderate degree of pain but there is a solid endpoint.  This is worse than 30 degrees of knee flexion.  Knee range of motion is from 5 degrees to 115 degrees.  Anterior drawer and posterior drawer stable.  Minimal pain with McMurray's with no appreciable clicking.     ASSESSMENT   1. Acute pain of right knee   2. Sprain of medial collateral ligament of right knee, initial encounter      PLAN:  Pertinent additional documentation may be included in corresponding procedure notes, imaging studies, problem based documentation and patient instructions.  Procedures:  None  Medications:  Meds ordered this encounter  Medications  . Diclofenac Sodium (PENNSAID) 2 % SOLN    Sig: Place 1 application onto the skin 2 (two) times daily for 1 day.    Dispense:  8 g    Refill:  0  . Diclofenac Sodium (PENNSAID) 2 % SOLN    Sig: Place 1 application onto the skin 2 (two) times daily.    Dispense:  112 g    Refill:  2    Home Phone      [phone] Work Phone      [phone] Mobile          [phone]     Discussion/Instructions: No problem-specific [place] notes found for this encounter.  Continue previously prescribed home exercise program.  RICE (Rest, ICE, Compression, Elevation) principles reviewed with the patient. Discussed red flag symptoms that warrant earlier emergent evaluation and patient voices understanding. Activity modifications and the importance of  avoiding exacerbating activities (limiting pain to no more than a 4 / 10 during or following activity) recommended and discussed. Overall she has had a mild improvement in her symptoms which is expected at this point for her MCL strain.  We will have her transition over to pen said which is a topical diclofenac.  She reports having a slight rash with diclofenac in the past but was only mild in nature.  This is local topical medication I suspect she should do well with it but she knows to discontinue it if she has any side effects with it.  Okay to continue Tylenol or otherwise intermittent ibuprofen as needed.  Okay to begin to return to activities as tolerated trolled environment such as stationary bike or elliptical recommend avoiding any uneven surfaces. If any lack of improvement: consider further diagnostic evaluation with MRI.  This was discussed today but given lack of mechanical symptoms and minimal knee effusion at last visit on MSK ultrasound will plan to hold off on this at this time.   Return in about 2 weeks (around 01/03/2018) for repeat clinical exam.          Gerda Diss, [place] [facility] Physician

## 2017-12-20 NOTE — Patient Instructions (Signed)
Pennsaid instructions: You have been given a sample/prescription for Pennsaid, a topical medication.     You are to apply this gel to your injured body part twice daily (morning and evening).   A little goes a long way so you can use about a pea-sized amount for each area.   Spread this small amount over the area into a thin film and let it dry.   Be sure that you do not rub the gel into your skin for more than 10 or 15 seconds otherwise it can irritate you skin.    Once you apply the gel, please do not put any other lotion or clothing in contact with that area for 30 minutes to allow the gel to absorb into your skin.   Some people are sensitive to the medication and can develop a sunburn-like rash.  If you have only mild symptoms it is okay to continue to use the medication but if you have any breakdown of your skin you should discontinue its use and please let us know.   If you have been written a prescription for Pennsaid, you will receive a pump bottle of this topical gel through a mail order pharmacy.  The instructions on the bottle will say to apply two pumps twice a day which may be too much gel for your particular area so use the pea-sized amount as your guide.   Instructions for Duexis, Pennsaid and Vimovo:  Your prescription will be filled through a participating [facility] mail order pharmacy.  You will receive a phone call or text from one of the participating pharmacies which can be located in any state in the [place].  You must communicate directly with them to have this medication filled.  When the pharmacy contacts you, they will need your mailing address (for shipment of the medication) andy they will need payment information if you have a copay (typically no more than $10). If you have not heard from them 2-3 days after your appointment with Dr. Paulla Fore, contact [facility] directly at [phone].

## 2018-01-02 ENCOUNTER — Encounter: Payer: Self-pay | Admitting: Sports Medicine

## 2018-01-02 ENCOUNTER — Ambulatory Visit: Payer: 59 | Admitting: Sports Medicine

## 2018-01-02 VITALS — BP 122/80 | HR 70 | Ht 66.0 in | Wt 198.2 lb

## 2018-01-02 DIAGNOSIS — M25561 Pain in right knee: Secondary | ICD-10-CM | POA: Diagnosis not present

## 2018-01-02 DIAGNOSIS — S83411A Sprain of medial collateral ligament of right knee, initial encounter: Secondary | ICD-10-CM

## 2018-01-02 NOTE — Progress Notes (Signed)
Deanna [name]. Deanna [name], [place] at [place] - 57 y.o. female MRN [id number]  Date of birth: Jun 11, 1960  Visit Date: 01/02/2018 PCP: Midge Minium, MD   Referred by: Midge Minium, MD   SUBJECTIVE:  Chief Complaint  Patient presents with  . f/u R knee    Sx show no change. using Pennsaid and taking Motrin prn with some relief. Wearing Tru Pull brace. Doing HEP daily.     HPI: Patient is here for follow-up of right knee pain.  She continues to have pain that is rated as a 7-8 out of 10.  Described as tightness.  Does not seem to be improving.  She does continue to get some nighttime disturbances.  She is using Pennsaid and a triple brace, the chiropractor and massage.  The brace does occasionally cause some discomfort over the medial knee.  She has been performing VMO strengthening and hip abduction exercises.  REVIEW OF SYSTEMS: Nighttime disturbances as above otherwise negative 12 point review of systems.  HISTORY:  Prior history reviewed and updated per electronic medical record.  Social History   Occupational History  . Not on file  Tobacco Use  . Smoking status: Never Smoker  . Smokeless tobacco: Never Used  Substance and Sexual Activity  . Alcohol use: Yes    Alcohol/week: 1.0 standard drinks    Types: 1 Glasses of wine per week    Comment: Per Week  . Drug use: Never  . Sexual activity: Yes   Social History   Social History Narrative  . Not on file    Past Medical History:  Diagnosis Date  . Allergy   . Chicken pox   . Frequent headaches   . Migraine   . Urinary tract infection    Past Surgical History:  Procedure Laterality Date  . BREAST SURGERY  1985  . TONSILLECTOMY  1975   family history includes Arthritis in her mother; Asthma in her mother; Cancer in her paternal grandfather; Early death in her maternal grandfather; Hearing loss in her father; Heart Problems in her  father; Heart attack in her maternal grandmother and paternal grandmother; Heart disease in her maternal grandmother and paternal grandmother; Hyperlipidemia in her father, maternal grandmother, mother, and paternal grandmother; Hypertension in her father, maternal grandmother, and paternal grandmother; Macular degeneration in her father; Stroke in her paternal grandfather.  OBJECTIVE:  VS:  HT:5\' 6"  (167.6 cm)   WT:198 lb 3.2 oz (89.9 kg)  BMI:32.01    BP:122/80  HR:70bpm  TEMP: ( )  RESP:97 %   PHYSICAL EXAM: Adult female. No acute distress.  Alert and appropriate. Her right knee is overall well aligned.  She still has pain with valgus testing.  Pain localizes to the medial knee.  No significant mechanical symptoms.  Pain is worse with a 30 degree flexed position.  No significant knee effusion.  Stable endpoint.   ASSESSMENT   1. Acute pain of right knee   2. Sprain of medial collateral ligament of right knee, initial encounter     PLAN:  Pertinent additional documentation may be included in corresponding procedure notes, imaging studies, problem based documentation and patient instructions.  Procedures:  None  Medications:  No orders of the defined types were placed in this encounter.   Discussion/Instructions: No problem-specific Assessment & Plan notes found for this encounter.  THERAPEUTIC EXERCISE: Discussed the foundation of treatment for this condition is physical therapy  and/or daily (5-6 days/week) therapeutic exercises, focusing on core strengthening, coordination, neuromuscular control/reeducation.  Continue previously prescribed home exercise program.  RICE (Rest, ICE, Compression, Elevation) principles reviewed with the patient. Discussed appropriate use of both heat and ice with the patient today.  Discussed red flag symptoms that warrant earlier emergent evaluation and patient voices understanding. Activity modifications and the importance of avoiding  exacerbating activities (limiting pain to no more than a 4 / 10 during or following activity) recommended and discussed. Overall she is continuing to do well.  She is approximately 4 weeks into the healing process and I suspect she has a an additional 4 to 6 weeks total healing.  We will plan to check back with her for repeat clinical exam in 4 weeks.  We will have her continue with the previously prescribed Pennsaid and she can use intermittent Duexis or over-the-counter ibuprofen as needed. If any lack of improvement: consider further diagnostic evaluation with MRI of the knee if any persistent symptoms.   Return in about 4 weeks (around 01/30/2018).          Gerda Diss, [place] [facility] Physician

## 2018-01-19 ENCOUNTER — Encounter: Payer: Self-pay | Admitting: Sports Medicine

## 2018-01-19 ENCOUNTER — Other Ambulatory Visit: Payer: Self-pay | Admitting: Physical Therapy

## 2018-01-19 DIAGNOSIS — M25561 Pain in right knee: Secondary | ICD-10-CM

## 2018-01-28 ENCOUNTER — Ambulatory Visit
Admission: RE | Admit: 2018-01-28 | Discharge: 2018-01-28 | Disposition: A | Discharge status: Home / Self Care | Payer: 59 | Source: Ambulatory Visit | Attending: Sports Medicine | Admitting: Sports Medicine

## 2018-01-28 DIAGNOSIS — M25561 Pain in right knee: Secondary | ICD-10-CM

## 2018-01-28 DIAGNOSIS — R600 Localized edema: Secondary | ICD-10-CM | POA: Diagnosis not present

## 2018-01-30 ENCOUNTER — Encounter: Payer: Self-pay | Admitting: Sports Medicine

## 2018-01-30 ENCOUNTER — Ambulatory Visit: Payer: 59 | Admitting: Sports Medicine

## 2018-01-30 VITALS — BP 124/82 | HR 78 | Ht 66.0 in | Wt 200.4 lb

## 2018-01-30 DIAGNOSIS — M25561 Pain in right knee: Secondary | ICD-10-CM | POA: Diagnosis not present

## 2018-01-30 DIAGNOSIS — S83411A Sprain of medial collateral ligament of right knee, initial encounter: Secondary | ICD-10-CM

## 2018-01-30 NOTE — Progress Notes (Signed)
Deanna [name]. , [place] at [place] - 58 y.o. female MRN [id number]  Date of birth: February 11, 1960  Visit Date: January 30, 2018  PCP: Midge Minium, MD   Referred by: Midge Minium, MD  SUBJECTIVE:  Chief Complaint  Patient presents with  . Right Knee - Follow-up    R knee pain.  MRI review today.  Using Pennsaid and has a TruPull knee brace.  Has HEP    HPI: Patient is here for follow-up of right knee pain.  She continues to have mechanical symptoms and reports pain that ranges from a 3/10 to an 8/10.  She reports is effectively no change.  She does not have any swelling or numbness.  Pain does continue to be medially located and she has focal tenderness palpation over this region.  Going up and down steps is problematic due to the medial knee pain but she has minimal popping and grinding.  REVIEW OF SYSTEMS: Per HPI  HISTORY:  Prior history reviewed and updated per electronic medical record.  Social History   Occupational History  . Not on file  Tobacco Use  . Smoking status: Never Smoker  . Smokeless tobacco: Never Used  Substance and Sexual Activity  . Alcohol use: Yes    Alcohol/week: 1.0 standard drinks    Types: 1 Glasses of wine per week    Comment: Per Week  . Drug use: Never  . Sexual activity: Yes   Social History   Social History Narrative  . Not on file    OBJECTIVE:  VS:  HT:5\' 6"  (167.6 cm)   WT:200 lb 6.4 oz (90.9 kg)  BMI:32.36    BP:124/82  HR:78bpm  TEMP: ( )  RESP:96 %   PHYSICAL EXAM: Adult female.  In no acute distress.  Alert and appropriate. Her right knee is overall well aligned with no significant effusion.  She has negative patellar grind.  She is focally tender over the medial collateral ligament and medial joint line but this is minimal.  Negative McMurray's.   ASSESSMENT   1. Acute pain of right knee   2. Sprain of medial collateral ligament  of right knee, initial encounter     PROCEDURES:  None  PLAN:  Pertinent additional documentation may be included in corresponding procedure notes, imaging studies, problem based documentation and patient instructions.  MRI results reviewed with her today.  She does have grade 1 MCL strain believe this is causing majority of her symptoms that she is focally tender only over the medial joint line.  The patellar arthritic changes likely incidental.  She has no pain with patellar grind.  I would like for her to work with physical therapy for therapeutic exercises and considering dry needling to the quad muscles.  If any lack of improvement over the next 4 weeks would consider intra-articular injection but given acute ligamentous injury to try to hold off on this and have her continue using the Pennsaid twice a day at least for the next 2 weeks.  >50% of this 25 minute visit spent in direct patient counseling and/or coordination of care.  Discussion was focused on education regarding the in discussing the pathoetiology and anticipated clinical course of the above condition.  Activity modifications and the importance of avoiding exacerbating activities (limiting pain to no more than a 4 / 10 during or following activity) recommended and discussed. Discussed red flag symptoms that warrant earlier  emergent evaluation and patient voices understanding. No orders of the defined types were placed in this encounter.  Lab Orders  No laboratory test(s) ordered today   Imaging Orders  No imaging studies ordered today    Referral Orders     Ambulatory referral to Physical Therapy   Return in about 4 weeks (around 02/27/2018).          Gerda Diss, [place] [facility] Physician

## 2018-02-18 ENCOUNTER — Encounter: Payer: Self-pay | Admitting: Sports Medicine

## 2018-02-21 DIAGNOSIS — S83411A Sprain of medial collateral ligament of right knee, initial encounter: Secondary | ICD-10-CM | POA: Diagnosis not present

## 2018-02-27 ENCOUNTER — Ambulatory Visit: Payer: 59 | Admitting: Sports Medicine

## 2018-03-21 ENCOUNTER — Encounter: Payer: Self-pay | Admitting: Podiatry

## 2018-03-21 DIAGNOSIS — M25561 Pain in right knee: Secondary | ICD-10-CM | POA: Diagnosis not present

## 2018-06-09 ENCOUNTER — Telehealth: Payer: Self-pay | Admitting: Family Medicine

## 2018-06-09 NOTE — Telephone Encounter (Signed)
LM asking pt to call back to schedule cpe after 09/18/18

## 2019-02-01 ENCOUNTER — Other Ambulatory Visit: Payer: Self-pay

## 2019-02-01 ENCOUNTER — Ambulatory Visit: Payer: 59 | Admitting: Family Medicine

## 2019-02-01 ENCOUNTER — Encounter: Payer: Self-pay | Admitting: Family Medicine

## 2019-02-01 VITALS — BP 122/78 | HR 77 | Temp 99.0°F | Resp 16 | Ht 65.5 in | Wt 203.8 lb

## 2019-02-01 DIAGNOSIS — R300 Dysuria: Secondary | ICD-10-CM

## 2019-02-01 DIAGNOSIS — R350 Frequency of micturition: Secondary | ICD-10-CM

## 2019-02-01 LAB — POCT URINALYSIS DIPSTICK
Bilirubin, UA: NEGATIVE
Blood, UA: NEGATIVE
Glucose, UA: NEGATIVE
Ketones, UA: NEGATIVE
Leukocytes, UA: NEGATIVE
Nitrite, UA: NEGATIVE
Protein, UA: NEGATIVE
Spec Grav, UA: <=1.005 — AB (ref 1.010–1.025)
Urobilinogen, UA: 0.2 E.U./dL
pH, UA: 6.5 (ref 5.0–8.0)

## 2019-02-01 MED ORDER — CEPHALEXIN 500 MG PO CAPS: 500.0000 mg | ORAL_CAPSULE | Freq: Two times a day (BID) | ORAL | 0 refills | Status: AC

## 2019-02-01 NOTE — Patient Instructions (Signed)
Follow up as needed or as scheduled Continue to drink plenty of fluids START the Cephalexin twice daily.  Take w/ food Call with any questions or concerns Stay Safe!  Stay Healthy!

## 2019-02-01 NOTE — Progress Notes (Signed)
   Subjective:    Patient ID: Deanna [name], female    DOB: [birth year], 59 y.o.   MRN: [id number]  HPI UTI- sxs started on NYEs after earlier episode of diarrhea.  Had frequency, urgency, some blood on toilet tissue.  Took AZO, cranberry, increased water intake.  sxs resolved by 1/2.  This week again had dysuria but no clouding of urine.  2nd void again had tinge of blood.  Used OTC dipstick.  Leuks +, nitrites negative.  Burning has resolved w/ increased water intake.  Increased frequency but small amounts.  Some urgency, suprapubic pressure.  No fevers.  Some LBP this week.   Review of Systems For ROS see HPI   This visit occurred during the SARS-CoV-2 public health emergency.  Safety protocols were in place, including screening questions prior to the visit, additional usage of staff PPE, and extensive cleaning of exam room while observing appropriate contact time as indicated for disinfecting solutions.       Objective:   Physical Exam Vitals reviewed.  Constitutional:      General: She is not in acute distress.    Appearance: She is well-developed.  Abdominal:     General: There is no distension.     Palpations: Abdomen is soft.     Tenderness: There is abdominal tenderness (+ suprapubic but no CVA tenderness ).  Neurological:     General: No focal deficit present.     Mental Status: She is oriented to person, place, and time.  Psychiatric:        Mood and Affect: Mood normal.        Behavior: Behavior normal.        Thought Content: Thought content normal.           Assessment & Plan:  Dysuria- pt's sxs are highly suspicious for UTI despite negative UA.  Will start Keflex and send urine for culture.  Reviewed supportive care and red flags that should prompt return.  Pt expressed understanding and is in agreement w/ plan.

## 2019-02-01 NOTE — Addendum Note (Signed)
Addended by: Doran Clay A on: 02/01/2019 01:54 PM   Modules accepted: Orders

## 2019-02-02 LAB — URINE CULTURE
MICRO NUMBER:: 10067212
Result:: NO GROWTH
SPECIMEN QUALITY:: ADEQUATE

## 2019-02-05 ENCOUNTER — Encounter: Payer: Self-pay | Admitting: Family Medicine

## 2019-02-16 ENCOUNTER — Telehealth: Payer: Self-pay | Admitting: *Deleted

## 2019-02-16 ENCOUNTER — Emergency Department (HOSPITAL_COMMUNITY)
Admission: EM | Admit: 2019-02-16 | Discharge: 2019-02-16 | Disposition: A | Discharge status: Home / Self Care | Payer: 59 | Attending: Emergency Medicine | Admitting: Emergency Medicine

## 2019-02-16 ENCOUNTER — Encounter (HOSPITAL_COMMUNITY): Payer: Self-pay | Admitting: *Deleted

## 2019-02-16 ENCOUNTER — Other Ambulatory Visit: Payer: Self-pay

## 2019-02-16 DIAGNOSIS — R040 Epistaxis: Secondary | ICD-10-CM | POA: Diagnosis present

## 2019-02-16 DIAGNOSIS — Z79899 Other long term (current) drug therapy: Secondary | ICD-10-CM | POA: Insufficient documentation

## 2019-02-16 MED ORDER — LIDOCAINE-EPINEPHRINE-TETRACAINE (LET) TOPICAL GEL
3.0000 mL | Freq: Once | TOPICAL | Status: DC
  Filled 2019-02-16: qty 3

## 2019-02-16 MED ORDER — LIDOCAINE-EPINEPHRINE 2 %-1:100000 IJ SOLN: 20.0000 mL | Freq: Once | INTRAMUSCULAR | Status: DC

## 2019-02-16 NOTE — ED Provider Notes (Signed)
[facility] EMERGENCY DEPARTMENT Provider Note   CSN: [id number] Arrival date & time: 02/16/19  1141     History Chief Complaint  Patient presents with  . Epistaxis    Deanna [name] is a 59 y.o. female with a hx of migraine headache presents to the Emergency Department complaining of sudden, now resolved epistaxis onset approximately 2 h prior to arrival. Patient reports she had a small nosebleed on Monday which resolved with Afrin but returned today (5 days later). Patient reports she attempted pressure and Afrin without relief however while waiting for evaluation the bleeding has stopped. She denies pain, fever, chills, vision changes. Patient is not taking aspirin, antiplatelets or anticoagulants. No known trauma. She denies frequent nosebleeds in the past. No specific aggravating or alleviating factors. She denies nasal steroids, snorting anything or digital trauma.   The history is provided by the patient and medical records. No language interpreter was used.       Past Medical History:  Diagnosis Date  . Allergy   . Chicken pox   . Frequent headaches   . Migraine   . Urinary tract infection     Patient Active Problem List   Diagnosis Date Noted  . Acute pain of right knee 12/10/2017  . Physical exam 09/16/2017  . Obesity (BMI 30.0-34.9) 09/16/2017    Past Surgical History:  Procedure Laterality Date  . BREAST SURGERY  1985  . TONSILLECTOMY  1975     OB History   No obstetric history on file.     Family History  Problem Relation Age of Onset  . Arthritis Mother   . Asthma Mother   . Hyperlipidemia Mother   . Hearing loss Father   . Hyperlipidemia Father   . Hypertension Father   . Heart Problems Father        Psychologist, forensic  . Macular degeneration Father   . Heart attack Maternal Grandmother   . Heart disease Maternal Grandmother   . Hyperlipidemia Maternal Grandmother   . Hypertension Maternal Grandmother   . Early death Maternal  Grandfather   . Heart disease Paternal Grandmother   . Hyperlipidemia Paternal Grandmother   . Hypertension Paternal Grandmother   . Heart attack Paternal Grandmother   . Cancer Paternal Grandfather   . Stroke Paternal Grandfather     Social History   Tobacco Use  . Smoking status: Never Smoker  . Smokeless tobacco: Never Used  Substance Use Topics  . Alcohol use: Yes    Alcohol/week: 1.0 standard drinks    Types: 1 Glasses of wine per week    Comment: Per Week  . Drug use: Never    Home Medications Prior to Admission medications   Medication Sig Start Date End Date Taking? Authorizing Provider  Ascorbic Acid (VITAMIN C) 1000 MG tablet Take 1,000 mg by mouth daily.    [provider]  Calcium Carbonate-Vit D-Min (CALCIUM 1200 PO) Take 1 tablet by mouth daily.    [provider]  Cholecalciferol (D3 ADULT PO) Take by mouth.    [provider]  hydrocortisone-pramoxine [facility]) 2.5-1 % rectal cream APPLY TO AFFECTED AREA 3 TO 4 TIMES DAILY 09/21/17   [provider]  Multiple Vitamins-Minerals (MULTIVITAMIN ADULT PO) Take 1 tablet by mouth daily.    [provider]  QSYMIA 3.75-23 MG CP24 Take 1 capsule by mouth every morning. 01/09/19   [provider]  rizatriptan (MAXALT-MLT) 10 MG disintegrating tablet TAKE 1 TABLET DAILY AS NEEDED  11/06/14   [provider]    Allergies    Pollen extract and Diclofenac  Review of Systems   Review of Systems  Constitutional: Negative for chills and fever.  HENT: Positive for nosebleeds. Negative for congestion.   Eyes: Negative for visual disturbance.  Respiratory: Negative for cough.   Cardiovascular: Negative for palpitations.  Gastrointestinal: Negative for nausea and vomiting.  Musculoskeletal: Negative for neck pain and neck stiffness.  Skin: Negative for wound.  Allergic/Immunologic: Negative for immunocompromised state.  Neurological: Negative for  light-headedness and headaches.  Hematological: Does not bruise/bleed easily.  Psychiatric/Behavioral: The patient is not nervous/anxious.     Physical Exam Updated Vital Signs BP (!) 139/92 (BP Location: Left Arm)   Pulse 84   Temp 98.6 F (37 C) (Oral)   Resp 18   Ht 5' 6.5" (1.689 m)   Wt 89.8 kg   SpO2 99%   BMI 31.48 kg/m   Physical Exam Vitals and nursing note reviewed.  Constitutional:      General: She is not in acute distress.    Appearance: She is well-developed.  HENT:     Head: Normocephalic and atraumatic.     Right Ear: External ear normal.     Left Ear: External ear normal.     Nose:     Right Nostril: Epistaxis present. No foreign body, septal hematoma or occlusion.     Left Nostril: No foreign body, epistaxis, septal hematoma or occlusion.     Comments: 2 small punctate areas on the anterior septum of the right nare, likely the source of bleeding. Not currently bleeding. Nares are widely patent. No posterior pharynx bleeding on exam. Eyes:     General: No scleral icterus.    Conjunctiva/sclera: Conjunctivae normal.  Cardiovascular:     Rate and Rhythm: Normal rate.  Pulmonary:     Effort: Pulmonary effort is normal.  Abdominal:     General: There is no distension.  Musculoskeletal:        General: Normal range of motion.     Cervical back: Normal range of motion.  Skin:    General: Skin is warm and dry.  Neurological:     Mental Status: She is alert.     ED Results / Procedures / Treatments    Procedures .Epistaxis Management  Date/Time: 02/16/2019 2:12 PM Performed by: Abigail Butts, PA-C Authorized by: Abigail Butts, PA-C   Consent:    Consent obtained:  Verbal   Consent given by:  Patient   Risks discussed:  Bleeding   Alternatives discussed:  No treatment Anesthesia (see MAR for exact dosages):    Anesthesia method:  Topical application   Topical anesthetic:  LET Procedure details:    Treatment site:  R anterior    Treatment method:  Silver nitrate   Treatment complexity:  Limited   Treatment episode: initial   Post-procedure details:    Assessment:  Bleeding stopped   Patient tolerance of procedure:  Tolerated well, no immediate complications   (including critical care time)  Medications Ordered in ED Medications  lidocaine-EPINEPHrine-tetracaine (LET) topical gel (has no administration in time range)    ED Course  I have reviewed the triage vital signs and the nursing notes.  Pertinent labs & imaging results that were available during my care of the patient were reviewed by me and considered in my medical decision making (see chart for details).    MDM Rules/Calculators/A&P  Patient presents with epistaxis.  On my exam she is no longer bleeding.  Patient is not hypertensive; no evidence of hypertensive urgency.  Patient is not on anticoagulant or antiplatelet therapy.  Small vessel noted in the anterior septum.  Let applied and site cauterized with silver nitrate.  No additional bleeding.  Patient will have close ENT follow-up.  Discussed reasons to return immediately to the emergency department and home treatments for potential rebleed.  Patient states understanding and is in agreement with the plan.  Final Clinical Impression(s) / ED Diagnoses Final diagnoses:  Epistaxis    Rx / DC Orders ED Discharge Orders    None       Bharath Bernstein, Gwenlyn Perking 02/16/19 1437    Blanchie Dessert, MD 02/19/19 [phone]

## 2019-02-16 NOTE — Telephone Encounter (Signed)
error 

## 2019-02-16 NOTE — Telephone Encounter (Signed)
Can you please advise on what the pt needed?

## 2019-02-16 NOTE — ED Triage Notes (Signed)
C/o bleeding out of the right nares on Monday was able to stop with pressure and afrin, states this am nose started bleeding again and wasn't able to stop states she bled for 2 hours, no bleeding at present.

## 2019-02-16 NOTE — Discharge Instructions (Addendum)
1. Medications: usual home medications 2. Treatment: If rebleeding happens -blow nose well, utilize Afrin, apply pressure for 10 minutes plus ice.  If bleeding persists please return to the emergency department. 3. Follow Up: Please followup with [facility] ENT (Dr. Redmond Baseman or Dr. Janace Hoard) for reevaluation within 1 week; Please return to the ER for persistent bleeding, fevers or other concerns.

## 2019-02-18 ENCOUNTER — Emergency Department (HOSPITAL_COMMUNITY)
Admission: EM | Admit: 2019-02-18 | Discharge: 2019-02-18 | Disposition: A | Discharge status: Home / Self Care | Payer: 59 | Attending: Emergency Medicine | Admitting: Emergency Medicine

## 2019-02-18 ENCOUNTER — Encounter (HOSPITAL_COMMUNITY): Payer: Self-pay | Admitting: Emergency Medicine

## 2019-02-18 DIAGNOSIS — I959 Hypotension, unspecified: Secondary | ICD-10-CM | POA: Diagnosis not present

## 2019-02-18 DIAGNOSIS — R040 Epistaxis: Secondary | ICD-10-CM | POA: Diagnosis present

## 2019-02-18 DIAGNOSIS — R001 Bradycardia, unspecified: Secondary | ICD-10-CM | POA: Insufficient documentation

## 2019-02-18 LAB — TYPE AND SCREEN
ABO/RH(D): O POS
Antibody Screen: NEGATIVE

## 2019-02-18 LAB — COMPREHENSIVE METABOLIC PANEL
ALT: 19 U/L (ref 0–44)
AST: 17 U/L (ref 15–41)
Albumin: 3.6 g/dL (ref 3.5–5.0)
Alkaline Phosphatase: 110 U/L (ref 38–126)
Anion gap: 8 (ref 5–15)
BUN: 16 mg/dL (ref 6–20)
CO2: 23 mmol/L (ref 22–32)
Calcium: 8.9 mg/dL (ref 8.9–10.3)
Chloride: 110 mmol/L (ref 98–111)
Creatinine, Ser: 0.91 mg/dL (ref 0.44–1.00)
GFR calc Af Amer: >60 mL/min (ref >60)
GFR calc non Af Amer: >60 mL/min (ref >60)
Glucose, Bld: 120 mg/dL — ABNORMAL HIGH (ref 70–99)
Potassium: 4.1 mmol/L (ref 3.5–5.1)
Sodium: 141 mmol/L (ref 135–145)
Total Bilirubin: 0.6 mg/dL (ref 0.3–1.2)
Total Protein: 6.2 g/dL — ABNORMAL LOW (ref 6.5–8.1)

## 2019-02-18 LAB — CBC WITH DIFFERENTIAL/PLATELET
Abs Immature Granulocytes: 0.03 10*3/uL (ref 0.00–0.07)
Basophils Absolute: 0.1 10*3/uL (ref 0.0–0.1)
Basophils Relative: 1 %
Eosinophils Absolute: 0.1 10*3/uL (ref 0.0–0.5)
Eosinophils Relative: 1 %
HCT: 41.9 % (ref 36.0–46.0)
Hemoglobin: 13.9 g/dL (ref 12.0–15.0)
Immature Granulocytes: 0 %
Lymphocytes Relative: 27 %
Lymphs Abs: 2.5 10*3/uL (ref 0.7–4.0)
MCH: 30.4 pg (ref 26.0–34.0)
MCHC: 33.2 g/dL (ref 30.0–36.0)
MCV: 91.7 fL (ref 80.0–100.0)
Monocytes Absolute: 0.6 10*3/uL (ref 0.1–1.0)
Monocytes Relative: 6 %
Neutro Abs: 6.1 10*3/uL (ref 1.7–7.7)
Neutrophils Relative %: 65 %
Platelets: 294 10*3/uL (ref 150–400)
RBC: 4.57 MIL/uL (ref 3.87–5.11)
RDW: 12.1 % (ref 11.5–15.5)
WBC: 9.4 10*3/uL (ref 4.0–10.5)
nRBC: 0 % (ref 0.0–0.2)

## 2019-02-18 LAB — ABO/RH: ABO/RH(D): O POS

## 2019-02-18 MED ORDER — HYDROCODONE-ACETAMINOPHEN 5-325 MG PO TABS: 2.0000 | ORAL_TABLET | ORAL | 0 refills | Status: DC | PRN

## 2019-02-18 MED ORDER — OXYMETAZOLINE HCL 0.05 % NA SOLN
1.0000 | Freq: Once | NASAL | Status: AC
  Administered 2019-02-18: 1 via NASAL
  Filled 2019-02-18: qty 30

## 2019-02-18 MED ORDER — CEPHALEXIN 500 MG PO CAPS: 500.0000 mg | ORAL_CAPSULE | Freq: Four times a day (QID) | ORAL | 0 refills | Status: DC

## 2019-02-18 NOTE — Discharge Instructions (Addendum)
Call Dr. Iona [name] office tomorrow to schedule an appointment to be seen.  Use a frozen pack across the bridge of your nose for comfort as we discussed.  You have also been given a prescription for hydrocodone and take as directed.  Return here for severe bleeding.

## 2019-02-18 NOTE — ED Provider Notes (Signed)
[facility] EMERGENCY DEPARTMENT Provider Note   CSN: [id number] Arrival date & time: 02/18/19  1151     History Chief Complaint  Patient presents with  . Epistaxis    Deanna [name] is a 59 y.o. female.  59 year old female presents with right-sided epistaxis.  Was seen recently for left-sided epistaxis and it was cauterized.  States that she does not take any blood thinners.  Denies any digital trauma.  No other bruising to her body noted.  Attempted to control with direct pressure without success.        Past Medical History:  Diagnosis Date  . Allergy   . Chicken pox   . Frequent headaches   . Migraine   . Urinary tract infection     Patient Active Problem List   Diagnosis Date Noted  . Acute pain of right knee 12/10/2017  . Physical exam 09/16/2017  . Obesity (BMI 30.0-34.9) 09/16/2017    Past Surgical History:  Procedure Laterality Date  . BREAST SURGERY  1985  . TONSILLECTOMY  1975     OB History   No obstetric history on file.     Family History  Problem Relation Age of Onset  . Arthritis Mother   . Asthma Mother   . Hyperlipidemia Mother   . Hearing loss Father   . Hyperlipidemia Father   . Hypertension Father   . Heart Problems Father        Psychologist, forensic  . Macular degeneration Father   . Heart attack Maternal Grandmother   . Heart disease Maternal Grandmother   . Hyperlipidemia Maternal Grandmother   . Hypertension Maternal Grandmother   . Early death Maternal Grandfather   . Heart disease Paternal Grandmother   . Hyperlipidemia Paternal Grandmother   . Hypertension Paternal Grandmother   . Heart attack Paternal Grandmother   . Cancer Paternal Grandfather   . Stroke Paternal Grandfather     Social History   Tobacco Use  . Smoking status: Never Smoker  . Smokeless tobacco: Never Used  Substance Use Topics  . Alcohol use: Yes    Alcohol/week: 1.0 standard drinks    Types: 1 Glasses of wine per week    Comment: Per  Week  . Drug use: Never    Home Medications Prior to Admission medications   Medication Sig Start Date End Date Taking? Authorizing Provider  Ascorbic Acid (VITAMIN C) 1000 MG tablet Take 1,000 mg by mouth daily.    [provider]  Calcium Carbonate-Vit D-Min (CALCIUM 1200 PO) Take 1 tablet by mouth daily.    [provider]  Cholecalciferol (D3 ADULT PO) Take by mouth.    [provider]  hydrocortisone-pramoxine [facility]) 2.5-1 % rectal cream APPLY TO AFFECTED AREA 3 TO 4 TIMES DAILY 09/21/17   [provider]  Multiple Vitamins-Minerals (MULTIVITAMIN ADULT PO) Take 1 tablet by mouth daily.    [provider]  QSYMIA 3.75-23 MG CP24 Take 1 capsule by mouth every morning. 01/09/19   [provider]  rizatriptan (MAXALT-MLT) 10 MG disintegrating tablet TAKE 1 TABLET DAILY AS NEEDED 11/06/14   [provider]    Allergies    Pollen extract and Diclofenac  Review of Systems   Review of Systems  All other systems reviewed and are negative.   Physical Exam Updated Vital Signs BP (!) 130/101 (BP Location: Right Arm)   Pulse (!) 113   Temp 97.7 F (36.5 C) (Axillary)   Resp (!) 23  SpO2 97%   Physical Exam Vitals and nursing note reviewed.  Constitutional:      General: She is not in acute distress.    Appearance: Normal appearance. She is well-developed. She is not toxic-appearing.  HENT:     Head: Normocephalic and atraumatic.     Nose:     Right Nostril: Epistaxis present.  Eyes:     General: Lids are normal.     Conjunctiva/sclera: Conjunctivae normal.     Pupils: Pupils are equal, round, and reactive to light.  Neck:     Thyroid: No thyroid mass.     Trachea: No tracheal deviation.  Cardiovascular:     Rate and Rhythm: Normal rate and regular rhythm.     Heart sounds: Normal heart sounds. No murmur. No gallop.   Pulmonary:     Effort: Pulmonary effort is normal. No respiratory distress.     Breath  sounds: Normal breath sounds. No stridor. No decreased breath sounds, wheezing, rhonchi or rales.  Abdominal:     General: Bowel sounds are normal. There is no distension.     Palpations: Abdomen is soft.     Tenderness: There is no abdominal tenderness. There is no rebound.  Musculoskeletal:        General: No tenderness. Normal range of motion.     Cervical back: Normal range of motion and neck supple.  Skin:    General: Skin is warm and dry.     Findings: No abrasion or rash.  Neurological:     Mental Status: She is alert and oriented to person, place, and time.     GCS: GCS eye subscore is 4. GCS verbal subscore is 5. GCS motor subscore is 6.     Cranial Nerves: No cranial nerve deficit.     Sensory: No sensory deficit.  Psychiatric:        Speech: Speech normal.        Behavior: Behavior normal.     ED Results / Procedures / Treatments   Labs (all labs ordered are listed, but only abnormal results are displayed) Labs Reviewed - No data to display  EKG EKG Interpretation  Date/Time:  Sunday February 18 2019 12:37:24 EST Ventricular Rate:  70 PR Interval:    QRS Duration: 87 QT Interval:  402 QTC Calculation: 434 R Axis:   85 Text Interpretation: Sinus rhythm Minimal ST elevation, anterior leads Early repolarization Confirmed by Lacretia Leigh ([id number]) on 02/18/2019 1:39:44 PM   Radiology No results found.  Procedures .Epistaxis Management  Date/Time: 02/18/2019 1:06 PM Performed by: Lacretia Leigh, MD Authorized by: Lacretia Leigh, MD   Consent:    Consent obtained:  Verbal   Consent given by:  Patient   Risks discussed:  Bleeding   Alternatives discussed:  No treatment Anesthesia (see MAR for exact dosages):    Anesthesia method:  None Procedure details:    Treatment site:  Unable to specify   Treatment method:  Nasal tampon   Treatment complexity:  Extensive   Treatment episode: initial   Post-procedure details:    Assessment:  Bleeding decreased    Patient tolerance of procedure:  Tolerated well, no immediate complications   (including critical care time)  Medications Ordered in ED Medications  oxymetazoline (AFRIN) 0.05 % nasal spray 1 spray (1 spray Each Nare Given 02/18/19 1212)    ED Course  I have reviewed the triage vital signs and the nursing notes.  Pertinent labs & imaging results that were available during my  care of the patient were reviewed by me and considered in my medical decision making (see chart for details).  Patient had a vagal episode while in his apartment when she became hypotensive bradycardic.  Responded well to IV fluids and blood pressure returned quickly.  Patient's labs are reviewed and she has no signs of anemia.  Hemoglobin here is 13.9.  Patient monitored for several hours here and case was discussed with ENT on-call, Dr. Janace Hoard, who recommend that patient keep her appointment with Dr. Redmond Baseman for next week as long as her bleeding had stopped.  Patient had no recurrence of severe bleeding here but did have some oozing of pink fluid.  Patient feels comfortable to go home at this time.  Return precautions given  CRITICAL CARE Performed by: Leota Jacobsen Total critical care time: 60 minutes Critical care time was exclusive of separately billable procedures and treating other patients. Critical care was necessary to treat or prevent imminent or life-threatening deterioration. Critical care was time spent personally by me on the following activities: development of treatment plan with patient and/or surrogate as well as nursing, discussions with consultants, evaluation of patient's response to treatment, examination of patient, obtaining history from patient or surrogate, ordering and performing treatments and interventions, ordering and review of laboratory studies, ordering and review of radiographic studies, pulse oximetry and re-evaluation of patient's condition.    MDM Rules/Calculators/A&P                       Final Clinical Impression(s) / ED Diagnoses Final diagnoses:  None    Rx / DC Orders ED Discharge Orders    None       Lacretia Leigh, MD 02/18/19 [phone]

## 2019-02-18 NOTE — ED Notes (Signed)
Pt feeling much better now. Pt sitting up and drinking coke and eating crackers. Per MD will ambulate after.

## 2019-02-18 NOTE — ED Triage Notes (Signed)
Pt arrives here via [facility] for recurrent nose bleed from left nare. Pt currently holding pressure and given suction due to blood in mouth as well.

## 2019-02-18 NOTE — ED Notes (Signed)
In room with she states she is "feeling very bad". Pt noted to be very pale and diaphoretic. Pt laid back and given a cool rag. BP had dropped to 70s and pt became bradycardic. EKG captured. 1L NS started and MD Zenia Resides called to bedside.

## 2019-02-19 ENCOUNTER — Encounter: Payer: Self-pay | Admitting: Family Medicine

## 2019-02-27 ENCOUNTER — Ambulatory Visit (INDEPENDENT_AMBULATORY_CARE_PROVIDER_SITE_OTHER): Payer: 59 | Admitting: Family Medicine

## 2019-02-27 ENCOUNTER — Other Ambulatory Visit: Payer: Self-pay

## 2019-02-27 ENCOUNTER — Encounter: Payer: Self-pay | Admitting: Family Medicine

## 2019-02-27 VITALS — BP 130/80 | HR 80 | Temp 98.6°F | Resp 16 | Ht 66.0 in | Wt 201.1 lb

## 2019-02-27 DIAGNOSIS — R030 Elevated blood-pressure reading, without diagnosis of hypertension: Secondary | ICD-10-CM

## 2019-02-27 DIAGNOSIS — R040 Epistaxis: Secondary | ICD-10-CM | POA: Diagnosis not present

## 2019-02-27 NOTE — Progress Notes (Signed)
   Subjective:    Patient ID: Deanna [name], female    DOB: 1960-06-30, 59 y.o.   MRN: [id number]  HPI ER f/u- pt was seen in ER on 2/5 and 2/7 w/ recurrent and uncontrollable nose bleeds.  At the time, BP was elevated to 150/88.  Used Afrin at home but bleeding did not stop after 3 hrs.  Had area cauterized.  Had recurrent bleed that also did not stop and had to have rhinorocket x5 days.  Has since seen ENT and had area cauterized again.  At the time, pt was on Qsymia (per OB/GYN) and she thinks BP went 'sky high'.  She stopped medication.  Since then BP has been normal.  No CP, SOB.  HAs have improved.  No blurry or double vision.   Review of Systems For ROS see HPI     Objective:   Physical Exam Vitals reviewed.  Constitutional:      General: She is not in acute distress.    Appearance: She is well-developed.  HENT:     Head: Normocephalic and atraumatic.  Eyes:     Conjunctiva/sclera: Conjunctivae normal.     Pupils: Pupils are equal, round, and reactive to light.  Neck:     Thyroid: No thyromegaly.  Cardiovascular:     Rate and Rhythm: Normal rate and regular rhythm.     Heart sounds: Normal heart sounds. No murmur.  Pulmonary:     Effort: Pulmonary effort is normal. No respiratory distress.     Breath sounds: Normal breath sounds.  Abdominal:     General: There is no distension.     Palpations: Abdomen is soft.     Tenderness: There is no abdominal tenderness.  Musculoskeletal:     Cervical back: Normal range of motion and neck supple.  Lymphadenopathy:     Cervical: No cervical adenopathy.  Skin:    General: Skin is warm and dry.  Neurological:     Mental Status: She is alert and oriented to person, place, and time.  Psychiatric:        Behavior: Behavior normal.           Assessment & Plan:  Elevated BP- this has resolved since being in the ER and stopping the Qsymia.  Currently asymptomatic.  No need for medication.  Will follow at future  visits.  Epistaxis- pt has seen ENT and things have improved considerably.  Will follow along.

## 2019-02-27 NOTE — Patient Instructions (Signed)
Follow up as needed Your BP looks great!  No cause for concern Call with any questions or concerns Stay Safe!  Stay Healthy!

## 2019-04-29 ENCOUNTER — Encounter: Payer: Self-pay | Admitting: Family Medicine

## 2019-05-11 ENCOUNTER — Other Ambulatory Visit: Payer: Self-pay | Admitting: Sports Medicine

## 2019-05-11 DIAGNOSIS — Z1231 Encounter for screening mammogram for malignant neoplasm of breast: Secondary | ICD-10-CM

## 2019-11-02 ENCOUNTER — Encounter: Payer: Self-pay | Admitting: Family Medicine

## 2020-05-23 ENCOUNTER — Ambulatory Visit: Payer: 59 | Admitting: Family Medicine

## 2020-05-23 ENCOUNTER — Encounter: Payer: Self-pay | Admitting: Family Medicine

## 2020-05-23 ENCOUNTER — Other Ambulatory Visit: Payer: Self-pay

## 2020-05-23 VITALS — BP 130/78 | HR 75 | Temp 97.4°F | Resp 19 | Ht 66.0 in | Wt 215.4 lb

## 2020-05-23 DIAGNOSIS — R030 Elevated blood-pressure reading, without diagnosis of hypertension: Secondary | ICD-10-CM | POA: Diagnosis not present

## 2020-05-23 DIAGNOSIS — E669 Obesity, unspecified: Secondary | ICD-10-CM | POA: Diagnosis not present

## 2020-05-23 DIAGNOSIS — R4789 Other speech disturbances: Secondary | ICD-10-CM

## 2020-05-23 DIAGNOSIS — F801 Expressive language disorder: Secondary | ICD-10-CM

## 2020-05-23 LAB — TSH: TSH: 3.55 u[IU]/mL (ref 0.35–4.50)

## 2020-05-23 LAB — LIPID PANEL
Cholesterol: 169 mg/dL (ref 0–200)
HDL: 40.6 mg/dL (ref >39.00)
LDL Cholesterol: 99 mg/dL (ref 0–99)
NonHDL: 128.49
Total CHOL/HDL Ratio: 4
Triglycerides: 149 mg/dL (ref 0.0–149.0)
VLDL: 29.8 mg/dL (ref 0.0–40.0)

## 2020-05-23 LAB — CBC WITH DIFFERENTIAL/PLATELET
Basophils Absolute: 0 10*3/uL (ref 0.0–0.1)
Basophils Relative: 0.6 % (ref 0.0–3.0)
Eosinophils Absolute: 0.1 10*3/uL (ref 0.0–0.7)
Eosinophils Relative: 1.6 % (ref 0.0–5.0)
HCT: 41.5 % (ref 36.0–46.0)
Hemoglobin: 14 g/dL (ref 12.0–15.0)
Lymphocytes Relative: 47.3 % — ABNORMAL HIGH (ref 12.0–46.0)
Lymphs Abs: 3 10*3/uL (ref 0.7–4.0)
MCHC: 33.8 g/dL (ref 30.0–36.0)
MCV: 88.8 fl (ref 78.0–100.0)
Monocytes Absolute: 0.5 10*3/uL (ref 0.1–1.0)
Monocytes Relative: 7.9 % (ref 3.0–12.0)
Neutro Abs: 2.7 10*3/uL (ref 1.4–7.7)
Neutrophils Relative %: 42.6 % — ABNORMAL LOW (ref 43.0–77.0)
Platelets: 261 10*3/uL (ref 150.0–400.0)
RBC: 4.67 Mil/uL (ref 3.87–5.11)
RDW: 13 % (ref 11.5–15.5)
WBC: 6.4 10*3/uL (ref 4.0–10.5)

## 2020-05-23 LAB — HEPATIC FUNCTION PANEL
ALT: 25 U/L (ref 0–35)
AST: 20 U/L (ref 0–37)
Albumin: 4.3 g/dL (ref 3.5–5.2)
Alkaline Phosphatase: 109 U/L (ref 39–117)
Bilirubin, Direct: 0.1 mg/dL (ref 0.0–0.3)
Total Bilirubin: 0.3 mg/dL (ref 0.2–1.2)
Total Protein: 7.1 g/dL (ref 6.0–8.3)

## 2020-05-23 LAB — BASIC METABOLIC PANEL
BUN: 10 mg/dL (ref 6–23)
CO2: 30 mEq/L (ref 19–32)
Calcium: 8.9 mg/dL (ref 8.4–10.5)
Chloride: 105 mEq/L (ref 96–112)
Creatinine, Ser: 0.84 mg/dL (ref 0.40–1.20)
GFR: 76.34 mL/min (ref >60.00)
Glucose, Bld: 91 mg/dL (ref 70–99)
Potassium: 3.7 mEq/L (ref 3.5–5.1)
Sodium: 142 mEq/L (ref 135–145)

## 2020-05-23 LAB — B12 AND FOLATE PANEL
Folate: 22.4 ng/mL (ref >5.9)
Vitamin B-12: 438 pg/mL (ref 211–911)

## 2020-05-23 NOTE — Assessment & Plan Note (Signed)
BP WNL today and physically asymptomatic.  Will continue to follow.

## 2020-05-23 NOTE — Assessment & Plan Note (Signed)
New.  Pt is having notable difficulty w/ both verbal and written expressive communication today.  Knowing her, this is markedly different than her baseline.  She kept saying that her daughter was 'going to get her 3rd baby' rather than saying she was having her 3rd baby.  She could not come up with the words for nor explain FMLA.  She is a very intelligent woman and this is far from her normal.  She states this has been going on since November and started ~2 weeks after she got her COVID booster.  Will refer for neuro psych testing and will likely need brain imaging.  Pt expressed understanding and is in agreement w/ plan.

## 2020-05-23 NOTE — Assessment & Plan Note (Signed)
See above

## 2020-05-23 NOTE — Progress Notes (Signed)
   Subjective:    Patient ID: Deanna [name], female    DOB: Oct 22, 1960, 60 y.o.   MRN: [id number]  HPI Elevated BP- BP is normal range.  No CP, SOB, visual changes, edema.  Obesity- pt has gained 15 lbs since last visit  Word finding difficulties- pt reports sxs started ~2 weeks after COVID booster in November.  Family has noticed that she will use incorrect words given the context.  She is not able to express herself clearly either in verbal or written form.  Pt has not had COVID.   Review of Systems For ROS see HPI   This visit occurred during the SARS-CoV-2 public health emergency.  Safety protocols were in place, including screening questions prior to the visit, additional usage of staff PPE, and extensive cleaning of exam room while observing appropriate contact time as indicated for disinfecting solutions.       Objective:   Physical Exam Vitals reviewed.  Constitutional:      General: She is not in acute distress.    Appearance: Normal appearance. She is well-developed. She is obese.  HENT:     Head: Normocephalic and atraumatic.  Eyes:     Conjunctiva/sclera: Conjunctivae normal.     Pupils: Pupils are equal, round, and reactive to light.  Neck:     Thyroid: No thyromegaly.  Cardiovascular:     Rate and Rhythm: Normal rate and regular rhythm.     Pulses: Normal pulses.     Heart sounds: Normal heart sounds. No murmur heard.   Pulmonary:     Effort: Pulmonary effort is normal. No respiratory distress.     Breath sounds: Normal breath sounds.  Abdominal:     General: There is no distension.     Palpations: Abdomen is soft.     Tenderness: There is no abdominal tenderness.  Musculoskeletal:     Cervical back: Normal range of motion and neck supple.     Right lower leg: No edema.     Left lower leg: No edema.  Lymphadenopathy:     Cervical: No cervical adenopathy.  Skin:    General: Skin is warm and dry.  Neurological:     Mental Status: She is alert and  oriented to person, place, and time.     Motor: No weakness.     Coordination: Coordination normal.     Gait: Gait normal.     Comments: Obvious expressive aphasia w/ word substitution and word finding difficulty  Psychiatric:        Mood and Affect: Mood normal.        Behavior: Behavior normal.           Assessment & Plan:

## 2020-05-23 NOTE — Patient Instructions (Signed)
Follow up after your Neuro Psych testing so we can regroup We'll notify you of your lab results and make any changes if needed Continue to work on healthy diet and regular exercise If not already taking, add a daily multivitamin Call with any questions or concerns Hang in there!!!

## 2020-05-23 NOTE — Assessment & Plan Note (Signed)
Deteriorated.  Pt has gained 15 lbs since last visit.  I doubt this is playing a role in her neurologic changes and expressive difficulties but it could put her at higher risk of CVA.  Will follow.

## 2020-05-26 ENCOUNTER — Encounter: Payer: Self-pay | Admitting: Family Medicine

## 2020-05-28 LAB — VITAMIN B1: Vitamin B1 (Thiamine): 9 nmol/L (ref 8–30)

## 2020-06-02 ENCOUNTER — Encounter: Payer: Self-pay | Admitting: Counselor

## 2020-07-09 ENCOUNTER — Encounter: Payer: Self-pay | Admitting: *Deleted

## 2020-07-10 ENCOUNTER — Ambulatory Visit: Payer: 59 | Admitting: Family Medicine

## 2020-07-29 ENCOUNTER — Encounter: Payer: Self-pay | Admitting: Counselor

## 2020-07-29 ENCOUNTER — Ambulatory Visit: Payer: 59

## 2020-07-29 ENCOUNTER — Other Ambulatory Visit: Payer: Self-pay

## 2020-07-29 ENCOUNTER — Ambulatory Visit (INDEPENDENT_AMBULATORY_CARE_PROVIDER_SITE_OTHER): Payer: 59 | Admitting: Counselor

## 2020-07-29 DIAGNOSIS — F09 Unspecified mental disorder due to known physiological condition: Secondary | ICD-10-CM

## 2020-07-29 DIAGNOSIS — G3101 Pick's disease: Secondary | ICD-10-CM

## 2020-07-29 DIAGNOSIS — F0280 Dementia in other diseases classified elsewhere without behavioral disturbance: Secondary | ICD-10-CM | POA: Diagnosis not present

## 2020-07-29 NOTE — Progress Notes (Signed)
[facility]  Patient Name: Deanna [name] MRN: [id number] Date of Birth: Aug 28, 1960 Age: 60 y.o. Education: 16 years  Measurement properties of test scores: IQ, Index, and Standard Scores (SS): Mean = 100; Standard Deviation = 15 Scaled Scores (Ss): Mean = 10; Standard Deviation = 3 Z scores (Z): Mean = 0; Standard Deviation = 1 T scores (T); Mean = 50; Standard Deviation = 10  TEST SCORES:    Note: This summary of test scores accompanies the interpretive report and should not be interpreted by unqualified individuals or in isolation without reference to the report. Test scores are relative to age, gender, and educational history as available and appropriate.   Performance Validity        "A" Random Letter Test Raw  Descriptor      Errors 0 Within Expectation  The Dot Counting Test: 13 Within Expectation  NAB Effort Index 7 Below Expectation      Mental Status Screening     Total Score Descriptor  MoCA 11 Moderate Dementia      Expected Functioning        Wide Range Achievement Test: Standard/Scaled Score Percentile      Word Reading 76 5      Attention/Processing Speed        Neuropsychological Assessment Battery (Attention Module, Form 1): T-score Percentile      Digits Forward 25 1      Digits Backwards 19 <1      Repeatable Battery for the Assessment of Neuropsychological Status (Form A): Scaled Score Percentile      Coding 12 75      Language        Neuropsychological Assessment Battery (Language Module, Form 1): T-score Percentile      Oral Production 41 18      Naming   (9) 19 <1      Word Picture Matching 6/6 ---      Sentence Picture Matching  6/7 ---      NACC UDS-3 T-score Percentile      Sentence Repetition 9 <1      Verbal Fluency:  T Score Percentile      Controlled Oral Word Association (F-A-S) 27 1      Semantic Fluency (Animals) 11 <1      Memory:        Neuropsychological Assessment Battery (Memory  Module, Form 1): T-score Percentile      List Learning           List A Immediate Recall   (1, 2, 2) 19 <1         List B Immediate Recall   (1) 20 <1         List A Short Delayed Recall   (0) 19 <1         List A Long Delayed Recall   (0) 19 <1         List A Percent Retention   (0 %) --- <1         List A Long Delayed Yes/No Recognition Hits   (5) --- <1         List A Long Delayed Yes/No Recognition False Alarms   (8) --- 2         List A Recognition Discriminability Index --- <1      Story Learning           Immediate Recall   (4, 5) 19 <1  Delayed Recall   (0) 22 <1         Percent Retention   (0 %) --- <1      Daily Living Memory            Immediate Recall   (9, 8) 19 <1          Delayed Recall   (0, 0) 19 <1          Percent Retention (0 %) --- <1          Recognition Hits    (5) --- <1      Repeatable Battery for the Assessment of Neuropsychological Status (Form A): Scaled Score Percentile         Figure Recall   (7) 5 5      Visuospatial/Constructional Functioning        Repeatable Battery for the Assessment of Neuropsychological Status (Form A): Standard/Scaled Score Percentile     Visuospatial/Constructional Index 92 30         Figure Copy   (20) 13 84         Judgment of Line Orientation   (10) --- 3-9      Executive Functioning        [employer] Test (MWCST): Standard/T-Score Percentile      Number of Categories Correct 22 <1      Number of Perseverative Errors 26 1      Number of Total Errors 19 <1      Percent Perseverative Errors 40 16  Executive Function Composite 57 <1      Trail Making Test: T-Score Percentile      Part A 46 34      Part B 37 9      [place] Diagnostic Aphasia Exam: Raw Score Scaled Score      Complex Ideational Material 3 1      Clock Drawing Raw Score Descriptor      Command 8 Borderline Impairment      Rating Scales        Clinical Dementia Rating Raw Score Descriptor      Sum of Boxes 2.0 MCI       Global Score 0.5 MCI      Quick Dementia Rating System Raw Score Descriptor      Sum of Boxes 2 MCI      Total Score 4.5 MCI  Geriatric Depression Scale - Short Form 3 Negative   Kohler Pellerito V. Nicole Kindred PsyD, [place] Clinical Neuropsychologist

## 2020-07-29 NOTE — Progress Notes (Signed)
   Psychometrist Note   Cognitive testing was administered to Deanna [name] by Lamar Benes, B.S. (Technician) under the supervision of Alphonzo Severance, Psy.D., ABN. [name] was able to tolerate all test procedures. Dr. Nicole Kindred met with the patient as needed to manage any emotional reactions to the testing procedures. Rest breaks were offered.    The battery of tests administered was selected by Dr. Nicole Kindred with consideration to the patient's current level of functioning, the nature of her symptoms, emotional and behavioral responses during the interview, level of literacy, observed level of motivation/effort, and the nature of the referral question. This battery was communicated to the psychometrist. Communication between Dr. Nicole Kindred and the psychometrist was ongoing throughout the evaluation and Dr. Nicole Kindred was immediately accessible at all times. Dr. Nicole Kindred provided supervision to the technician on the date of this service, to the extent necessary to assure the quality of all services provided.    Ms. Wyly will return in approximately one week for an interactive feedback session with Dr. Nicole Kindred, at which time test performance, clinical impressions, and treatment recommendations will be reviewed in detail. The patient understands she can contact our office should she require our assistance before this time.   A total of 100 minutes of billable time were spent with Deanna [name] by the technician, including test administration and scoring time. Billing for these services is reflected in Dr. Les Pou note.   This note reflects time spent with the psychometrician and does not include test scores, clinical history, or any interpretations made by Dr. Nicole Kindred. The full report will follow in a separate note.

## 2020-07-29 NOTE — Progress Notes (Signed)
[facility]  Patient Name: Deanna [name] MRN: [id number] Date of Birth: [birth year] Age: 60 y.o. Education: 16 years  Referral Circumstances and Background Information  [name] is a 60 y.o., left-hand dominant, married woman with a history of word finding difficulties since November, 2021. 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 [name] noticed also, he was home for three weeks related to the holidays. He describes his wife as at the "top eschelon" of leadership and cognitive ability and so the difficulties she has been having are a definite change for her. She is extremely close with her son, Deanna [name], and he reportedly noticed subtle changes as early as June, 2021. The patient describes her problems mainly as word finding difficulties although her husband notices other issues including using the incorrect word. Her husband acknowledged mostly semantic paraphasic errors, although she will also negate things. For instance she will say "is not" when she intends to say "is." This has gotten to the point that it has become difficult to discuss things with her at times. On specific review of symptoms, she has no difficulties with understanding the meaning of a single word, she has no major comprehension problems, although she may have difficulties with syntactically complex material such as when having a discussion. She does have a hard time with written and verbal expression, she had to do a self-evaluation at work and it was "almost unintelligible" as per her husband. The patient herself thinks that she does better when she is typing. She does not notice any slow effortful speech. She has no problems making speech sounds. She has no changes in prosody. On review of other cognitive systems, her husband does notice some repeating in terms of asking the same question over and over again, within a few  hours. She has problems with decision making and problem solving to some extent, for example she had a very hard time figuring out how to file for unemployment after she stopped working. Her husband thought this was odd because she would get things like that done rapidly in the past. With respect to behavior and personality changes, she may have diminished frustration tolerance, although it doesn't sound like there are any profound changes. They are denying any inappropriately familiar behavior, using rough language in public, or diminished impulse control. She has no markedly decreased empathy. There are no clear compulsive or repetitive behaviors. They are also denying any significant apathy or inertia, she is still active. They do notice that she will get off topic in conversations, "I ask her one thing and she'll reply to a completely different question."   With respect to mood, the patient is denying that she has any significant sadness and otherwise had a hard time elaborating on her mood. She said that she was very tired when she first became symptomatic, although she is doing better now. She sleeps adequately at night, usually around 8 hours. She had gained a bit of weight, although she has started to lose it.   With respect to functioning, the patient was working from September until May, 2021 when she quit to help her daughter who was pregnant. She was able to still do her job but it sounds like it was quite difficult, she was using a sheet in order to aid in communication with patients. 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 The patient has always managed money for the household, she is quite meticulous and uses a spreadsheet to track everything. Her husband is not noticing any changes in her ability to do that. She has had no difficulties with driving, she is not getting lost, she has no accidents, she drove to [place] herself within the past year without any difficulties. She is still able to cook and does not have problems doing so at her typical level of skill. She is not on any medications but remembers to take the supplements she is on. She has no difficulties using the community or getting a list of groceries. Her husband does not notice any major difficulties with functioning, although she does have a hard time with less routine things. For instance with the unemployment, as above. She has a hard time putting bills on autopay and doing other such things on the computer that would have been easy in the past.   Past Medical History and Review of Relevant Studies   Patient Active Problem List   Diagnosis Date Noted   Elevated BP without diagnosis of hypertension 05/23/2020   Word finding difficulty 05/23/2020   Expressive speech disorder 05/23/2020   Acute pain of right knee 12/10/2017   Physical exam 09/16/2017   Obesity (BMI 30.0-34.9) 09/16/2017    Review of Neuroimaging and Relevant Medical History: The patient denied any history of strokes, seizures, brain injuries, or neurological surgeries. She has no neuroimaging.  Current Outpatient Medications  Medication Sig Dispense Refill   Ascorbic Acid (VITAMIN C) 1000 MG tablet Take 1,000 mg by mouth daily.     Calcium Carbonate-Vit D-Min (CALCIUM 1200 PO) Take 1 tablet by mouth daily.     Cholecalciferol (D3 ADULT PO) Take by mouth.     hydrocortisone-pramoxine (ANALPRAM-HC) 2.5-1 % rectal cream APPLY TO AFFECTED AREA 3 TO 4 TIMES DAILY  2   Multiple Vitamins-Minerals (MULTIVITAMIN ADULT PO) Take 1 tablet by mouth daily.     rizatriptan (MAXALT-MLT) 10 MG disintegrating tablet TAKE 1 TABLET DAILY AS NEEDED     No current facility-administered medications for this visit.   Family History  Problem Relation Age of Onset   Arthritis Mother    Asthma Mother    Hyperlipidemia Mother    Hearing loss Father    Hyperlipidemia Father     Hypertension Father    Heart Problems Father        Psychologist, forensic   Macular degeneration Father    Heart attack Maternal Grandmother    Heart disease Maternal Grandmother    Hyperlipidemia Maternal Grandmother    Hypertension Maternal Grandmother    Early death Maternal Grandfather    Heart disease Paternal Grandmother    Hyperlipidemia Paternal Grandmother    Hypertension Paternal Grandmother    Heart attack Paternal Grandmother    Cancer Paternal Grandfather    Stroke Paternal Grandfather    There is no family history of dementia. There is no  family history of psychiatric illness.  Psychosocial History  Developmental, Educational and Employment History: The patient is a native of [place]. The patient was a good student who had no learning problems and was never held back. They did not provide much detail about her specific grades. She did an Contractor at [employer], she was a double major in Film/video editor. She has worked in a number of different capacities. She has worked in a number of different capacities, mainly in the medical  field. 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   Psychiatric History: None  Substance Use History: The patient does not drink, she doesn't use any illicit substances and is a non-smoker.   Relationship History and Living Cimcumstances: The patient and her husband have been married 59 years. She has two daughters and a son.   Mental Status and Behavioral Observations  Sensorium/Arousal: The patient's level of arousal was awake and alert. Hearing and vision were adequate for testing purposes. Orientation: The patient was alert and generally oriented to person, place, and  situation although she was off 4 days on the date. Unable to recall names of current and past [place] presidents but did convincingly describe them.  Appearance: Dressed in appropriate casual clothing with reasonable grooming and hygiene. Behavior: Pleasant, appropriate, did present as confused at times with questions. Had significant difficulties with some test task instructions as per technician.  Speech/language: The patient spoke fluently although she had marked word finding problems, had both semantic and phonemic paraphasic errors, and at times almost had an empty quality to her speech. She was able to reliably follow simple two-step appendicular commands but had difficulties with complex conditional commands. She did seem to have to think very hard even about the simple commands. She was qualitatively fluent and had no slow effortful speech, articulatory groping, or speech praxis problems.  Gait/Posture: Not formally examined Movement: No rest tremor, obvious bradykinesia, masked facies etc.  Social Comportment: Appropriate Mood: "Good" Affect: Mainly neutral Thought process/content: The patient's thought process was difficult to disentangle from her language. She would respond to questions tangentially although it was unclear if this was more on the basis of limited comprehension or thought process problems. She was not overtly disorganized and had no thought disorder to suggest psychosis. Thought content was appropriate to topics discussed.  Safety: No safety concerns identified in this euthymic patient.  Insight: Fair, patient seems to have some awareness.   Test Procedures  Wide Range Achievement Test - 4             Word Reading Neuropsychological Assessment Battery  List Learning  Story Learning  Daily Living Memory  Naming  Digit Span Repeatable Battery for the Assessment of Neuropsychological Status (Form A)  Figure Copy  Judgment of Line Orientation  Coding  Figure Recall The  Dot Counting Test A Random Letter Test Controlled Oral Word Association (F-A-S) Semantic Fluency (Animals) Trail Making Test A & B Complex Ideational Material Modified [place] Card Sorting Test Geriatric Depression Scale - Short Form Quick Dementia Rating System (completed by husband, Deanna [name])  Sarita was seen for a psychiatric diagnostic evaluation and neuropsychological testing. She is a pleasant, 60 year old, left-hand dominant patient of Dr. Virgil Benedict who was referred for evaluation of aphasia and language problems. She and her husband noticed her difficulties in November, 2021 shortly after she received her third [place] vaccine although her son noticed the changes about 6 months prior to that. On exam, she is demonstrating problems with naming, comprehension of more complex material, and repetition. She had no slow effortful speech and was qualitatively fluent. Full and complete note with impressions, recommendations, and interpretation of test data to follow.   [name] PsyD, [place] Clinical Neuropsychologist  Informed Consent  Risks and benefits of the evaluation were discussed with the patient prior to all testing procedures. I conducted a clinical interview and neuropsychological testing (at least two tests) with Deanna [name] and Deanna [name], B.S. (Technician) administered additional test procedures. The patient was able to tolerate the testing procedures and the patient (and/or family if applicable) is likely to benefit from further follow up to receive the diagnosis and treatment recommendations, which will be rendered at the next encounter.

## 2020-08-04 NOTE — Progress Notes (Signed)
[facility]  Patient Name: Deanna [name] MRN: [id number] Date of Birth: 11/16/1960 Age: 60 y.o. Education: 50 years  Clinical Impressions  Deanna [name] is a 60 y.o., left-hand dominant, married woman with a history of aphasia (mostly word finding problems). The patient and her husband noticed her symptoms in November, 2021 shortly after she received her third [place] vaccination although the patient's son noticed changes in May, 2021 even before that. The main difficulty the patient and her husband report are word finding problems, word substitution, and there may be some subtle changes in her ability to do higher-level activities (e.g., she had a very hard time figuring out how to apply for unemployment when she quit her last job).   On exam, Deanna [name] demonstrated notable language problems that likely undermined her performance on most verbal tasks and may have also affected non-verbal performance given difficulties with test-task instructions. She had qualitatively fluent speech with markedly impaired naming, repetition, and weak comprehension (able to follow simple two-step commands but not complex conditional commands). Qualitative features included possible surface dyslexia on single-word reading as well as semantic and phonologic paraphasias. She had no articulatory issues, speech praxis problems, slow effortful speech or changes in prosody. Likewise, she did not show single word comprehension deficits, loss of object knowledge, or two-way naming problems. Beyond that she is showing impaired performance on measures of memory and executive abilities. Verbal memory deficits may be on the basis of language problems but I think there are primary problems as well, because she also had difficulties with delayed recall of verbal information. She screened negative for the presence of depression and was characterized as functioning at a mild cognitive impairment  level problem.   Deanna [name] is thus demonstrating a language problem characterized by deficient naming and repetition. She was also noted to have fairly significant comprehension deficits but she has no problems with single-word comprehension. Her presentation is concerning for a Primary Progressive Aphasia. She most closely resembles the logopenic subtype type and has some supportive features (e.g., memory impairment), although she does have some less characteristic findings in terms of surface dyslexia and the extent of her comprehension difficulties, which may make this an unclassifiable phenotype as per current criteria. Other etiologies need to be ruled out prior to definitive diagnosis and she certainly needs an MRI of the brain. This may be a particularly helpful diagnostic study in her case given the differential and I am happy to review the images with her if desired. She may benefit from speech and language pathology for rehabilitation and/or compensatory communication strategies.   Diagnostic Impressions: Mild cognitive impairment Aphasia  Recommendations to be discussed with patient  Deanna presentation and performance are consistent with an aphasia syndrome. Aphasia is the term used to describe problems in language. Aphasia can be caused by numerous etiologies including neurodegenerative syndromes, strokes, tumors, and anything else that causes damage to/dysfunction of language centers in the brain. You also had deficits in some other areas, including on measures of executive functioning and memory. While I do think that this is most likely a primary progressive aphasia (I.e., neurodegenerative condition), other etiologies need to be ruled out, and for that reason Deanna official diagnosis is mild cognitive impairment, with aphasia.   You should liaison with Dr. Birdie Riddle regarding an MRI of the brain. Although I do not think it is likely that an alternative cause will be found on this study, we  cannot know prior to ordering it  and it may be helpful in its own right, because primary progressive aphasia often shows a characteristic pattern of volume loss. I am happy to review those images with you if desired.   If there is a characteristic pattern of volume loss, than this will increase our confidence that we are dealing with [name] of the PPA subtypes and may even lead to a tentative pathologic diagnosis because some of the diseases associated with this syndrome are very strongly associated with typical imaging presentations. If there is no characteristic pattern of volume loss but there also are no strokes, seizures, or other explanatory findings, then I think this is still PPA although it may not be clearly classifiable. You most closely resemble the logopenic variant, which is often due to underlying Alzheimer's disease, although you have some features of the semantic variant as well (that is most often caused by TDP-43-C pathology but can also be caused by Alzheimer's among other things).   In terms of Deanna main difficulties, you had problems with word retrieval (I.e., naming things) and with repetition, although you also had fairly significant difficulties with comprehension, that is understanding things that others say. These sorts of problems are often at least partially responsive to modifications and/or therapy.   You could consider speech and language pathology to help you maximize Deanna language abilities, learn compensatory strategies, and for ongoing monitoring of Deanna condition. The [facility] may be a good resource, they can be reached at [phone] and have several locations. The service you are looking for is speech and language therapy/cognitive rehab for primary progressive aphasia.   The following are tips that may be helpful for communicating with Deanna [name]. They are designed for individuals with Primary Progressive Aphasia, but may also be helpful  in any condition involving language problems.   Simplify Deanna speech without dumbing it down Use simple, direct sentence structures. For example, "I am going to the store to buy fruit" is better than "At the store, I am going to buy fruit." Use "high frequency" words. These are words that are more commonly used in everyday conversation (ex. "give" instead of "donate"). Think of each word as part of a hierarchy. If a specific "level" of the word is not understood, move down to a simpler level. For example, if the person diagnosed does not understand "basset hound," move to "dog," and if they do not understand that, try "animal." If you are presenting a choice or a decision to the person with PPA, ask yourself: Can this be narrowed down into fewer options? Can this be broken down into a yes/no question?  Set the scene for clear communication Find a quiet place with limited distractions. Save key communications for a time of day where the person with PPA is most "fresh" and best able to engage. Allow extra time. Stress does not help communication flow. Slow down, take a deep breath, and try again.  When in a conversation with a person living with PPA: Don't correct, but do clarify. If you understood the person living with PPA, then it doesn't really matter if they mispronounced a word. But if you didn't understand them, ask them to repeat themselves, or paraphrase what you heard and ask if that is correct. Keep it positive. People with PPA will say the wrong word. Just try to keep the dialogue going in a non-judgmental, supportive way. Develop a plan for "filling in the blank." If the person living with PPA cannot express a word,  do they want you to supply the word for them? Give them more time to think of it? Write it down? Maybe there are situations where they want their communication partner to do most of the talking.  Other tips: Follow the lead of the person living with PPA. How do they wish to  be spoken to? Talked about? Supported? Included in conversations? Utilize the improv principle of "yes, and." Accept what was said, no matter how imperfectly, and add to it to build conversation. Be flexible. If [name] word doesn't work, try another. If a conversation just isn't working, take a break, or try having it in smaller mini-conversations over time. Change Deanna mindset. PPA is progressive; language skills will continue to deteriorate. Adaptability is key. 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 Plan ahead. Look at [employer] and choose meals ahead of time. Review goals for a doctor's visit in advance. Strategize how to engage at a family reunion. Include others. Develop a small group of people who will graciously accept communication faux pas. Children tend to be really good at this. Isolation is common in PPA; foster connection. Determine the goal of Deanna communication. If Deanna goal is for the individual to comprehend information (like legal or financial records), accuracy is important. If Deanna goal is connection (for example, saying "I love you"), you will have more flexibility. Get regular eye and hearing appointments to ensure that visual and/or hearing challenges aren't getting in the way. Miscommunication happens to everyone. Laugh when you can, mourn when you need to, and exercise patience.  While the above strategies are likely the best ways to improve functioning, you may also consider discussing an antidementia medication with Dr. Bennye Alm. Although there are no specific medications for PPA, often times Aricept or other medications approved for other dementia syndromes can be helpful.   I do not think that you should return to work. If this is PPA then it will worsen over time, and I think it is questionable that you would be able to learn a new job given Deanna cognitive difficulties where you are now. This means that these test findings are supportive of disability status,  should you choose to apply.   I would recommend that you return for reevaluation in [name] year so that we can keep an eye on Deanna progress and monitor you over time.   Test Findings  Test scores are summarized in additional documentation associated with this encounter. Test scores are relative to age, gender, and educational history as available and appropriate. There were no concerns about performance validity as all findings fell within normal expectations.   General Intellectual Functioning/Achievement:  Performance on single word reading was unusually low, which likely reflects some decline. Patient had regularization errors and possible surface dyslexia as per technician.   Attention and Processing Efficiency: Performance on indicators of digit repetition was extremely low for both digits forward and digits backward, which may reflect the extent of the patient's language problems. She did better on timed number-symbol coding, which was average.   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 Specifically, her visual object confrontation naming and sentence repetition were extremely low. Her comprehension, as assessed by ability to follow commands and respond to a series of yes/no questions was impaired, as she had difficulty with two-step conditional commands and more  complex grammatical constructions (e.g., "Is a chicken bigger than a spider?", "Will a cork sink in water?"). She had no two-way naming difficulties. She performed a 6/6 on word-picture matching although she missed an item on sentence picture matching with 6/7 correct. Qualitative fluency and oral production in response to a picture stimulus were intact but generation of words was extremely low in  response to phonemic and semantic prompts.   Visuospatial Function: Performance was overall reasonable on measures of visuospatial/constructional abilities, with an average overall score. She nevertheless did have some subtle difficulties on a measure of judgment of line orientation. I think that these are likely not explained by comprehension problems, because many of the answers were quite close, suggesting she was responding appropriately but simply was not accurate. Figure copy on the other hand was very good and in fact errorless.   Learning and Memory: Performance on measures of learning and memory was likely undermined to some extent by language problems. Nevertheless, she also had difficulty retaining visual information and her husband notices qualitative language problems; therefore, I think that there likely is a legitimate memory problem apart from her language problems.   In the verbal realm, her performance was extremely low on all indicators including immediate recall for a 12-item word list, short story, and brief daily-living type information. She did not retain any of this information and performance was extremely low on delayed-recall in all cases. Recognition disriminability was also extremely low.   In the visual realm, delayed recall for a modestly complex geometric figure stimulus was unusually low.   Executive Functions: Performance was weak within this domain when considered as a whole, suggesting some likely impairment. Her best area of performance was on the challenging Trail Making B Test, which fell at a low average (almost unusually low) level. All other scores were low. The Executive Function Composite from the [employer] was extremely low. She had low scores for both perseverative errors and categories completed. Generation of words in response to the letters F-A-S was extremely low as was reasoning with verbal information. Both of these may have  been undermined by language problems to some extent. Clock drawing was borderline, with intact numbering and face formation yet hands placed at 10:50 in a stimulus bound fashion.   Rating Scale(s): Ms. Hesseltine screened negative for the presence of depression. 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 I think she would have problems in competitive employment at present.   [name], PsyD, ABN Clinical Neuropsychologist  Coding and Compliance  Billing below reflects technician time, my direct face-to-face time with the patient, time spent in test administration, and time spent in professional activities including but not limited to: neuropsychological test interpretation, integration of neuropsychological test data with clinical history, report preparation, treatment planning, care coordination, and review of diagnostically pertinent medical history or studies.   Services associated with this encounter: Clinical Interview [phone]) plus 160 minutes (96132/96133; Neuropsychological Evaluation by Professional)  30 minutes (96136/96137; Test Administration by Professional) 100 minutes (96138/96139; Neuropsychological Testing by Technician)

## 2020-08-05 ENCOUNTER — Encounter: Payer: Self-pay | Admitting: Counselor

## 2020-08-05 ENCOUNTER — Ambulatory Visit (INDEPENDENT_AMBULATORY_CARE_PROVIDER_SITE_OTHER): Payer: 59 | Admitting: Counselor

## 2020-08-05 ENCOUNTER — Other Ambulatory Visit: Payer: Self-pay

## 2020-08-05 DIAGNOSIS — R4701 Aphasia: Secondary | ICD-10-CM | POA: Diagnosis not present

## 2020-08-05 DIAGNOSIS — R419 Unspecified symptoms and signs involving cognitive functions and awareness: Secondary | ICD-10-CM | POA: Diagnosis not present

## 2020-08-05 DIAGNOSIS — F0280 Dementia in other diseases classified elsewhere without behavioral disturbance: Secondary | ICD-10-CM | POA: Insufficient documentation

## 2020-08-05 NOTE — Progress Notes (Signed)
   [facility]  Feedback Note: I met with Deanna [name] to review the findings resulting from her neuropsychological evaluation. Since the last appointment, she has been about the same. Time was spent reviewing the impressions and recommendations that are detailed in the evaluation report. We discussed impression of language problems involving prominent deficits in naming and repetition, albeit with some additional signs and symptoms. I was candid with them that this is likely PPA, which is virtually always caused by a progressive form of brain disease. For now however, her diagnosis is Neurocognitive disorder with aphasia because she still needs an MRI. Other topics of intervention as reflected in the patient instructions. I took time to explain the findings and answer all the patient's questions. I encouraged Deanna [name] to contact me should she have any further questions or if further follow up is desired.   Current Medications and Medical History   Current Outpatient Medications  Medication Sig Dispense Refill   Ascorbic Acid (VITAMIN C) 1000 MG tablet Take 1,000 mg by mouth daily.     Calcium Carbonate-Vit D-Min (CALCIUM 1200 PO) Take 1 tablet by mouth daily.     Cholecalciferol (D3 ADULT PO) Take by mouth.     hydrocortisone-pramoxine (ANALPRAM-HC) 2.5-1 % rectal cream APPLY TO AFFECTED AREA 3 TO 4 TIMES DAILY  2   Multiple Vitamins-Minerals (MULTIVITAMIN ADULT PO) Take 1 tablet by mouth daily.     rizatriptan (MAXALT-MLT) 10 MG disintegrating tablet TAKE 1 TABLET DAILY AS NEEDED     No current facility-administered medications for this visit.    Patient Active Problem List   Diagnosis Date Noted   Elevated BP without diagnosis of hypertension 05/23/2020   Word finding difficulty 05/23/2020   Expressive speech disorder 05/23/2020   Acute pain of right knee 12/10/2017   Physical exam 09/16/2017   Obesity (BMI 30.0-34.9) 09/16/2017    Mental Status  and Behavioral Observations  Deanna [name] presented on time to the present encounter and was alert and generally oriented. Speech was normal in rate, rhythm, volume, and prosody. Self-reported mood was "good" and affect was normal. Thought process was circumstantial to tangential, although some of that may have been on the basis of language problems and thought content was appropriate to the topics discussed. There were no safety concerns identified at today's encounter, such as thoughts of harming self or others.   Plan  Feedback provided regarding the patient's neuropsychological evaluation. I was candid that this is likely PPA, which is by definition progressive, but she needs an MRI. Encouraged them to reach out if they would like to follow up personally regarding MRI results . Deanna [name] was encouraged to contact me if any questions arise or if further follow up is desired.   [name], PsyD, ABN Clinical Neuropsychologist  Service(s) Provided at This Encounter: 60 minutes [phone]; Conjoint therapy with patient present)

## 2020-08-05 NOTE — Patient Instructions (Signed)
Your presentation and performance are consistent with an aphasia syndrome. Aphasia is the term used to describe problems in language. Aphasia can be caused by numerous etiologies including neurodegenerative syndromes, strokes, tumors, and anything else that causes damage to/dysfunction of language centers in the brain. You also had deficits in some other areas, including on measures of executive functioning and memory. 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   You should liaison with Dr. Birdie Riddle regarding an MRI of the brain. Although I do not think it is likely that an alternative cause will be found on this study, we cannot know prior to ordering it and it may be helpful in its own right, because primary progressive aphasia often shows a characteristic pattern of volume loss. I am happy to review those images with you if desired.   If there is a characteristic pattern of volume loss, than this will increase our confidence that we are dealing with one of the PPA subtypes and may even lead to a tentative pathologic diagnosis because some of the diseases associated with this syndrome are very strongly associated with typical imaging presentations. If there is no characteristic pattern of volume loss but there also are no strokes, seizures, or other explanatory findings, then I think this is still PPA although it may not be clearly classifiable. You most closely resemble the logopenic variant, which is often due to underlying Alzheimer's disease, although you have some features of the semantic variant as well (that is most often caused by TDP-43-C pathology but can also be caused by Alzheimer's among other things).   In terms of your main difficulties, you had problems with word retrieval (I.e., naming things) and with  repetition, although you also had fairly significant difficulties with comprehension, that is understanding things that others say. These sorts of problems are often at least partially responsive to modifications and/or therapy.   You could consider speech and language pathology to help you maximize your language abilities, learn compensatory strategies, and for ongoing monitoring of your condition. The [facility] may be a good resource, they can be reached at [phone] and have several locations. The service you are looking for is speech and language therapy/cognitive rehab for primary progressive aphasia.   The following are tips that may be helpful for communicating with your loved one. They are designed for individuals with Primary Progressive Aphasia, but may also be helpful in any condition involving language problems.   Simplify your speech without dumbing it down Use simple, direct sentence structures. For example, "I am going to the store to buy fruit" is better than "At the store, I am going to buy fruit." Use "high frequency" words. These are words that are more commonly used in everyday conversation (ex. "give" instead of "donate"). Think of each word as part of a hierarchy. If a specific "level" of the word is not understood, move down to a simpler level. For example, if the person diagnosed does not understand "basset hound," move to "dog," and if they do not understand that, try "animal." If you are presenting a choice or a decision to the person with PPA, ask yourself: Can this be narrowed down into fewer options? Can this be broken down into a yes/no question?   Set the scene for clear communication Find a quiet place with limited  distractions. Save key communications for a time of day where the person with PPA is most "fresh" and best able to engage. Allow extra time. Stress does not help communication flow. Slow down, take a deep breath, and try  again.   When in a conversation with a person living with PPA: Don't correct, but do clarify. If you understood the person living with PPA, then it doesn't really matter if they mispronounced a word. But if you didn't understand them, ask them to repeat themselves, or paraphrase what you heard and ask if that is correct. Keep it positive. People with PPA will say the wrong word. Just try to keep the dialogue going in a non-judgmental, supportive way. Develop a plan for "filling in the blank." If the person living with PPA cannot express a word, do they want you to supply the word for them? Give them more time to think of it? Write it down? Maybe there are situations where they want their communication partner to do most of the talking.   Other tips: Follow the lead of the person living with PPA. How do they wish to be spoken to? Talked about? Supported? Included in conversations? Utilize the improv principle of "yes, and." Accept what was said, no matter how imperfectly, and add to it to build conversation. Be flexible. If one word doesn't work, try another. If a conversation just isn't working, take a break, or try having it in smaller mini-conversations over time. Change your mindset. PPA is progressive; language skills will continue to deteriorate. Adaptability is key. 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 Plan ahead. Look at [employer] and choose meals ahead of time. Review goals for a doctor's visit in advance. Strategize how to engage at a family reunion. Include others. Develop a small group of people who will graciously accept communication faux pas. Children tend to be really good at this. Isolation is common in PPA; foster connection. Determine the goal of your communication. If your goal is for the individual to comprehend information (like legal or financial records), accuracy is important. If your goal is connection (for example, saying "I love you"), you will have more  flexibility. Get regular eye and hearing appointments to ensure that visual and/or hearing challenges aren't getting in the way. Miscommunication happens to everyone. Laugh when you can, mourn when you need to, and exercise patience.   While the above strategies are likely the best ways to improve functioning, you may also consider discussing an antidementia medication with Dr. Bennye Alm. Although there are no specific medications for PPA, often times Aricept or other medications approved for other dementia syndromes can be helpful.    I do not think that you should return to work. If this is PPA then it will worsen over time, and I think it is questionable that you would be able to learn a new job given your cognitive difficulties where you are now. This means that these test findings are supportive of disability status, should you choose to apply.   I would recommend that you return for reevaluation in one year so that we can keep an eye on your progress and monitor you over time.

## 2020-08-13 ENCOUNTER — Ambulatory Visit: Payer: 59 | Admitting: Registered Nurse

## 2020-08-13 ENCOUNTER — Other Ambulatory Visit: Payer: Self-pay

## 2020-08-13 ENCOUNTER — Encounter: Payer: Self-pay | Admitting: Registered Nurse

## 2020-08-13 VITALS — BP 150/74 | HR 66 | Temp 97.8°F | Resp 18 | Ht 66.0 in | Wt 207.5 lb

## 2020-08-13 DIAGNOSIS — R4701 Aphasia: Secondary | ICD-10-CM | POA: Diagnosis not present

## 2020-08-13 NOTE — Progress Notes (Signed)
Established Patient Office Visit  Subjective:  Patient ID: Deanna [name], female    DOB: [birth year]  Age: 60 y.o. MRN: [id number]  CC:  Chief Complaint  Patient presents with   Follow-up    Patient states she needs a order for a MRI. And would like to discuss Vitamin D.   Otitis Media    HPI Deanna [name] presents for follow up   Seen by neuropsych Dr. Nicole Kindred. Seems to be PPA per his analysis He does want her to have an MRI brain to follow up on any other etiologies. She has been thoroughly counseled in nonpharm care and is pursuing these changes In addition, is pursuing plant based diet, regular exercise 1-2 times daily  Would like to discuss vitamin supplementation including micronutrients. Taking vitamin d supplement OTC daily  Concern for other processes that may be affecting her levels  Here today with her husband.  Past Medical History:  Diagnosis Date   Allergy    Chicken pox    Frequent headaches    Migraine    Urinary tract infection     Past Surgical History:  Procedure Laterality Date   BREAST SURGERY  1985   TONSILLECTOMY  1975    Family History  Problem Relation Age of Onset   Arthritis Mother    Asthma Mother    Hyperlipidemia Mother    Hearing loss Father    Hyperlipidemia Father    Hypertension Father    Heart Problems Father        Psychologist, forensic   Macular degeneration Father    Heart attack Maternal Grandmother    Heart disease Maternal Grandmother    Hyperlipidemia Maternal Grandmother    Hypertension Maternal Grandmother    Early death Maternal Grandfather    Heart disease Paternal Grandmother    Hyperlipidemia Paternal Grandmother    Hypertension Paternal Grandmother    Heart attack Paternal Grandmother    Cancer Paternal Grandfather    Stroke Paternal Grandfather     Social History   Socioeconomic History   Marital status: Married    Spouse name: Not on file   Number of children: Not on file   Years of education: Not on file    Highest education level: Not on file  Occupational History   Not on file  Tobacco Use   Smoking status: Never   Smokeless tobacco: Never  Vaping Use   Vaping Use: Never used  Substance and Sexual Activity   Alcohol use: Yes    Alcohol/week: 1.0 standard drink    Types: 1 Glasses of wine per week    Comment: Per Week   Drug use: Never   Sexual activity: Yes  Other Topics Concern   Not on file  Social History Narrative   Not on file   Social Determinants of Health   Financial Resource Strain: Not on file  Food Insecurity: Not on file  Transportation Needs: Not on file  Physical Activity: Not on file  Stress: Not on file  Social Connections: Not on file  Intimate Partner Violence: Not on file    Outpatient Medications Prior to Visit  Medication Sig Dispense Refill   Ascorbic Acid (VITAMIN C) 1000 MG tablet Take 1,000 mg by mouth daily.     Calcium Carbonate-Vit D-Min (CALCIUM 1200 PO) Take 1 tablet by mouth daily.     Cholecalciferol (D3 ADULT PO) Take by mouth.     Multiple Vitamins-Minerals (MULTIVITAMIN ADULT PO) Take 1 tablet by mouth  daily.     rizatriptan (MAXALT-MLT) 10 MG disintegrating tablet TAKE 1 TABLET DAILY AS NEEDED     hydrocortisone-pramoxine (ANALPRAM-HC) 2.5-1 % rectal cream APPLY TO AFFECTED AREA 3 TO 4 TIMES DAILY (Patient not taking: Reported on 08/13/2020)  2   No facility-administered medications prior to visit.    Allergies  Allergen Reactions   Pollen Extract     Seasonal allergies   Diclofenac Rash    Rash, headache (oral diclofenac).    ROS Review of Systems  Constitutional: Negative.   HENT: Negative.    Eyes: Negative.   Respiratory: Negative.    Cardiovascular: Negative.   Gastrointestinal: Negative.   Genitourinary: Negative.   Musculoskeletal: Negative.   Skin: Negative.   Neurological: Negative.   Psychiatric/Behavioral: Negative.    All other systems reviewed and are negative.    Objective:    Physical Exam Vitals  and nursing note reviewed.  Constitutional:      General: She is not in acute distress.    Appearance: Normal appearance. She is normal weight. She is not ill-appearing, toxic-appearing or diaphoretic.  Cardiovascular:     Rate and Rhythm: Normal rate and regular rhythm.     Heart sounds: Normal heart sounds. No murmur heard.   No friction rub. No gallop.  Pulmonary:     Effort: Pulmonary effort is normal. No respiratory distress.     Breath sounds: Normal breath sounds. No stridor. No wheezing, rhonchi or rales.  Chest:     Chest wall: No tenderness.  Skin:    General: Skin is warm and dry.  Neurological:     General: No focal deficit present.     Mental Status: She is alert and oriented to person, place, and time. Mental status is at baseline.  Psychiatric:        Mood and Affect: Mood normal.        Behavior: Behavior normal.        Thought Content: Thought content normal.        Judgment: Judgment normal.    BP (!) 150/74   Pulse 66   Temp 97.8 F (36.6 C) (Temporal)   Resp 18   Ht '5\' 6"'$  (1.676 m)   Wt 207 lb 8 oz (94.1 kg)   SpO2 98%   BMI 33.49 kg/m  Wt Readings from Last 3 Encounters:  08/13/20 207 lb 8 oz (94.1 kg)  05/23/20 215 lb 6.4 oz (97.7 kg)  02/27/19 201 lb 2 oz (91.2 kg)     Health Maintenance Due  Topic Date Due   Zoster Vaccines- Shingrix (1 of 2) Never done   MAMMOGRAM  08/12/2019   PAP SMEAR-Modifier  10/12/2019   COVID-19 Vaccine (4 - Booster for Pfizer series) 04/04/2020   INFLUENZA VACCINE  08/11/2020    There are no preventive care reminders to display for this patient.  Lab Results  Component Value Date   TSH 3.55 05/23/2020   Lab Results  Component Value Date   WBC 6.4 05/23/2020   HGB 14.0 05/23/2020   HCT 41.5 05/23/2020   MCV 88.8 05/23/2020   PLT 261.0 05/23/2020   Lab Results  Component Value Date   NA 142 05/23/2020   K 3.7 05/23/2020   CO2 30 05/23/2020   GLUCOSE 91 05/23/2020   BUN 10 05/23/2020   CREATININE  0.84 05/23/2020   BILITOT 0.3 05/23/2020   ALKPHOS 109 05/23/2020   AST 20 05/23/2020   ALT 25 05/23/2020   PROT 7.1 05/23/2020  ALBUMIN 4.3 05/23/2020   CALCIUM 8.9 05/23/2020   ANIONGAP 8 02/18/2019   GFR 76.34 05/23/2020   Lab Results  Component Value Date   CHOL 169 05/23/2020   Lab Results  Component Value Date   HDL 40.60 05/23/2020   Lab Results  Component Value Date   LDLCALC 99 05/23/2020   Lab Results  Component Value Date   TRIG 149.0 05/23/2020   Lab Results  Component Value Date   CHOLHDL 4 05/23/2020   No results found for: HGBA1C    Assessment & Plan:   Problem List Items Addressed This Visit       Other   Aphasia - Primary   Relevant Orders   MR Brain W Wo Contrast    No orders of the defined types were placed in this encounter.   Follow-up: No follow-ups on file.   PLAN Will order MRI brain wwo to follow up on PPA.  Discussed that micronutrient panel would be a thread to pull considered by neuro but not worthwhile to do here in primary care. Follow up as scheduled with PCP Patient encouraged to call clinic with any questions, comments, or concerns.  I spent 33 minutes with this patient reviewing likely diagnosis, plan of care, and nonpharm maintenance.  Maximiano Coss, NP

## 2020-08-13 NOTE — Patient Instructions (Signed)
Ms Deanna [name] to meet you  MRI will be ordered - not showing up on this Visit Summary as I'm working to find who can get you in soonest.  They will call you to schedule  You all are doing great work. The exact right things to keep things steady. Keep it up  I'll be in touch with MRI results  Dr. Birdie Riddle will be Cc'd on this visit  Thank you  Rich

## 2020-08-23 ENCOUNTER — Other Ambulatory Visit: Payer: Self-pay

## 2020-08-23 ENCOUNTER — Ambulatory Visit (HOSPITAL_COMMUNITY)
Admission: RE | Admit: 2020-08-23 | Discharge: 2020-08-23 | Disposition: A | Discharge status: Home / Self Care | Payer: 59 | Source: Ambulatory Visit | Attending: Registered Nurse | Admitting: Registered Nurse

## 2020-08-23 DIAGNOSIS — R4701 Aphasia: Secondary | ICD-10-CM

## 2020-08-23 MED ORDER — GADOBUTROL 1 MMOL/ML IV SOLN
9.0000 mL | Freq: Once | INTRAVENOUS | Status: AC | PRN
  Administered 2020-08-23: 9 mL via INTRAVENOUS

## 2020-08-25 ENCOUNTER — Telehealth: Payer: Self-pay

## 2020-08-25 ENCOUNTER — Encounter: Payer: Self-pay | Admitting: Registered Nurse

## 2020-08-25 NOTE — Telephone Encounter (Signed)
Please advise 

## 2020-08-25 NOTE — Telephone Encounter (Signed)
Pt went to the hospital at [facility] pt is wanting to know if she is going on antibiotic meds due to the labs results Pt said that Delfino Lovett is aware of this information.  Pt call back [phone]

## 2020-08-29 NOTE — Telephone Encounter (Signed)
Spouse has called in regard to referral.  Please follow back up with patient at [phone] once referral has been placed.

## 2020-09-04 ENCOUNTER — Other Ambulatory Visit: Payer: Self-pay

## 2020-09-04 ENCOUNTER — Encounter: Payer: Self-pay | Admitting: Neurology

## 2020-09-04 DIAGNOSIS — R419 Unspecified symptoms and signs involving cognitive functions and awareness: Secondary | ICD-10-CM

## 2020-09-04 NOTE — Telephone Encounter (Signed)
Referral placed to neurology with Dr. Posey Pronto. Patient also has follow up question. Would like to know if "presumed to be longstanding finding" does it refer to atrophy, gliosis, or both? Please advise.

## 2020-11-02 NOTE — Telephone Encounter (Signed)
This concern has been previously addressed by myself and/or another provider.  If they patient has ongoing concerns, they can contact me at their convenience.  Thank you,  Rich Orvell Careaga, NP 

## 2020-11-10 ENCOUNTER — Ambulatory Visit: Payer: 59 | Admitting: Neurology

## 2020-11-12 ENCOUNTER — Other Ambulatory Visit: Payer: Self-pay

## 2020-11-12 ENCOUNTER — Ambulatory Visit: Payer: 59 | Admitting: Neurology

## 2020-11-12 ENCOUNTER — Encounter: Payer: Self-pay | Admitting: Neurology

## 2020-11-12 VITALS — BP 139/84 | HR 82 | Ht 66.0 in | Wt 208.0 lb

## 2020-11-12 DIAGNOSIS — G3101 Pick's disease: Secondary | ICD-10-CM

## 2020-11-12 DIAGNOSIS — F0280 Dementia in other diseases classified elsewhere without behavioral disturbance: Secondary | ICD-10-CM

## 2020-11-12 NOTE — Patient Instructions (Addendum)
Referral to speech therapy  We will refer you to [facility]

## 2020-11-12 NOTE — Progress Notes (Signed)
[facility] Clinic Note - Initial Visit   Date: 11/12/20  Deanna [name] MRN: [id number] DOB: 08/28/1960   Dear Dr. Birdie Riddle:  Thank you for your kind referral of Deanna [name] for consultation of primary progressive aphagia. Although her history is well known to you, please allow [place] to reiterate it for the purpose of our medical record. The patient was accompanied to the clinic by husband who also provides collateral information.     History of Present Illness: Deanna [name] is a 60 y.o. left-handed female presenting for evaluation of cognitive impairment and aphasia. Starting in November 2021, following her third Loyal vaccine, she began having "brian fog", forgetting names, and having word-finding difficulty. 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  She noticed that she would not be able to interact with the patient well. She is no longer working.  Their son noticed that she may have not been focused also in May 2021. After she noticed these changes, she started increasing exercise, following a plant-based diet, and added supplements to their diet. She also started cognitive exercises, such as flash cards to recall names of fruits and vegetables, reading, and exercising daily.  Her PCP referred her for neuropsychological testing., which was suggestive of primary progressive aphasia.  MRI brain from August 2022 shows left temporal lobe atrophy and to a lesser degree involving the left frontal and parietal region. Labs including TSH, vitamin B12, vitamin B1, and folate is normal.   She drives and denies getting lost or confused. No driving issues reported by husband.  She manages finances and is able to performs household chores.    Out-side paper records, electronic medical record, and images have been reviewed where available and summarized as:  MRI brain  08/23/2020: Atrophy of the left hemisphere compared to the right, most advanced in the temporal lobe with lesser involvement in the frontal and parietal region. Very little if any gliosis. This is presumed be a longstanding finding, possibly due to prenatal or perinatal insult.   Minimal small vessel ischemic change of the cerebral hemispheric white matter otherwise.   No acute or reversible finding  Lab Results  Component Value Date   [id number] 294 05/23/2020   Lab Results  Component Value Date   TSH 3.55 05/23/2020   No results found for: ESRSEDRATE, POCTSEDRATE  Past Medical History:  Diagnosis Date   Allergy    Chicken pox    Frequent headaches    Migraine    Urinary tract infection     Past Surgical History:  Procedure Laterality Date   BREAST SURGERY  1985   TONSILLECTOMY  1975     Medications:  Outpatient Encounter Medications as of 11/12/2020  Medication Sig   Ascorbic Acid (VITAMIN C) 1000 MG tablet Take 1,000 mg by mouth daily.   Multiple Vitamins-Minerals (MULTIVITAMIN ADULT PO) Take 1 tablet by mouth daily. B1   naproxen sodium (ALEVE) 220 MG tablet Take 220 mg by mouth.   Calcium Carbonate-Vit D-Min (CALCIUM 1200 PO) Take 1 tablet by mouth daily. (Patient not taking: Reported on 11/12/2020)   Cholecalciferol (D3 ADULT PO) Take by mouth. (Patient not taking: Reported on 11/12/2020)   hydrocortisone-pramoxine (ANALPRAM-HC) 2.5-1 % rectal cream APPLY TO AFFECTED AREA 3 TO 4 TIMES DAILY (Patient not taking: No sig reported)   [DISCONTINUED] rizatriptan (MAXALT-MLT) 10 MG disintegrating tablet TAKE 1 TABLET  DAILY AS NEEDED (Patient not taking: Reported on 11/12/2020)   No facility-administered encounter medications on file as of 11/12/2020.    Allergies:  Allergies  Allergen Reactions   Pollen Extract     Seasonal allergies   Diclofenac Rash    Rash, headache (oral diclofenac).    Family History: Family History  Problem Relation Age of Onset   Arthritis  Mother    Asthma Mother    Hyperlipidemia Mother    Hearing loss Father    Hyperlipidemia Father    Hypertension Father    Heart Problems Father        Psychologist, forensic   Macular degeneration Father    Heart attack Maternal Grandmother    Heart disease Maternal Grandmother    Hyperlipidemia Maternal Grandmother    Hypertension Maternal Grandmother    Early death Maternal Grandfather    Heart disease Paternal Grandmother    Hyperlipidemia Paternal Grandmother    Hypertension Paternal Grandmother    Heart attack Paternal Grandmother    Cancer Paternal Grandfather    Stroke Paternal Grandfather     Social History: Social History   Tobacco Use   Smoking status: Never   Smokeless tobacco: Never  Vaping Use   Vaping Use: Never used  Substance Use Topics   Alcohol use: Yes    Alcohol/week: 1.0 standard drink    Types: 1 Glasses of wine per week    Comment: Per Week   Drug use: Never   Social History   Social History Narrative   Left Handed    Lives in a two story home     Vital Signs:  BP 139/84   Pulse 82   Ht 5\' 6"  (1.676 m)   Wt 208 lb (94.3 kg)   SpO2 96%   BMI 33.57 kg/m   Neurological Exam: MENTAL STATUS:  She is oriented to self and place. Incorrect date. Poor judgement and insight - when asked what she would do if the house was on fire, she reports "phone...police..."  Concentration is good.  There is hesitation with speech, word substitution, and poor content at times.  Speech is not dysarthric. Luria test negative.  Unable to correctly show two fingers on left hand or the right thumb.  CRANIAL NERVES: II:  No visual field defects.  III-IV-VI: Pupils equal round and reactive to light.  Normal conjugate, extra-ocular eye movements in all directions of gaze.  No nystagmus.  No ptosis.   V:  Normal facial sensation.    VII:  Normal facial symmetry and movements.  Facial reflexes are absent.  VIII:  Normal hearing and vestibular function.   IX-X:  Normal palatal  movement.   XI:  Normal shoulder shrug and head rotation.   XII:  Normal tongue strength and range of motion, no deviation or fasciculation.  MOTOR:  Motor strength is 5/5 throughout.  No atrophy, fasciculations or abnormal movements.  No pronator drift.   MSRs:  Right        Left                  brachioradialis 2+  2+  biceps 2+  2+  triceps 2+  2+  patellar 2+  2+  ankle jerk 2+  2+  Hoffman no  no  plantar response down  down   SENSORY:  Normal and symmetric perception of light touch, pinprick, vibration    COORDINATION/GAIT: Normal finger-to- nose-finger.  Intact rapid alternating movements bilaterally.    Gait narrow  based and stable.   IMPRESSION: Primary progressive aphasia as supported by neuropsychological testing and MRI findings which shows prominent left temporal atrophy. Patient and husband had many questions, which were addressed to the best of my ability.  They were informed that this is a neurodegenerative condition without cure and there is no treatment to stop progressive of atrophy.  Role of medication such as acetylcholinesterase inhibitors discussed.  At this time, patient prefers not to take medication.   I recommend starting speech therapy. Continue cognitive exercises as she is already doing Referral to [facility]  Total time spent reviewing records, interview, history/exam, documentation, counseling, and coordination of care on day of encounter:  60 min   Thank you for allowing me to participate in patient's care.  If I can answer any additional questions, I would be pleased to do so.    Sincerely,    Ileanna Gemmill K. Posey Pronto, DO

## 2020-11-25 ENCOUNTER — Ambulatory Visit: Payer: 59 | Attending: Neurology

## 2020-11-25 ENCOUNTER — Other Ambulatory Visit: Payer: Self-pay

## 2020-11-25 DIAGNOSIS — R41841 Cognitive communication deficit: Secondary | ICD-10-CM | POA: Diagnosis present

## 2020-11-25 DIAGNOSIS — R4701 Aphasia: Secondary | ICD-10-CM | POA: Insufficient documentation

## 2020-11-26 NOTE — Patient Instructions (Signed)
  Think about [employer] instead, or in addition to [facility]. This app has more of a language focus.

## 2020-11-27 ENCOUNTER — Ambulatory Visit: Payer: 59

## 2020-11-27 ENCOUNTER — Other Ambulatory Visit: Payer: Self-pay

## 2020-11-27 DIAGNOSIS — R4701 Aphasia: Secondary | ICD-10-CM | POA: Diagnosis not present

## 2020-11-27 DIAGNOSIS — R41841 Cognitive communication deficit: Secondary | ICD-10-CM

## 2020-11-27 NOTE — Therapy (Signed)
[facility] [address], [place], [place], [address] Phone: [phone]   Fax:  [phone]  Speech Language Pathology Evaluation  Patient Details  Name: Deanna [name] MRN: [id number] Date of Birth: 1960/10/28 Referring Provider (SLP): Narda Amber, MD   Encounter Date: 11/25/2020   End of Session - 11/26/20 1716     Visit Number 1    Number of Visits 17    Date for SLP Re-Evaluation 01/30/21   added approx one week due to holidays   SLP Start Time 1450    SLP Stop Time  1535    SLP Time Calculation (min) 45 min    Activity Tolerance Patient tolerated treatment well             Past Medical History:  Diagnosis Date   Allergy    Chicken pox    Frequent headaches    Migraine    Urinary tract infection     Past Surgical History:  Procedure Laterality Date   [place]    There were no vitals filed for this visit.   Subjective Assessment - 11/26/20 0821     Subjective "I knew that was really helpenning (helping.)"    Currently in Pain? No/denies                SLP Evaluation [facility] - 11/27/20 0001       SLP Visit Information   SLP Received On 11/25/20    Referring Provider (SLP) Narda Amber, MD    Onset Date November 2021    Medical Diagnosis Progressive Primary Aphasia      Subjective   Patient/Family Stated Goal preserve language ability      Pain Assessment   Currently in Pain? No/denies      General Information   HPI Family noted pt with reduced verbal expression skills in November 2021, which progressively worsened. Neuropsych Nicole Kindred) in July 2022 dx'd Aphasia syndrome instead of Primary Progressive Aphasia (PPA)  (Logopenic or Semantic variant) due to lack of imaging to corroborate/definitively dx. From Dr. Les Pou report: "Ms. Vandoren is thus demonstrating a language problem characterized by deficient naming and repetition. She was also noted to have fairly  significant comprehension deficits but she has no problems with single-word comprehension. Her presentation is concerning for a Primary Progressive Aphasia. She most closely resembles the logopenic subtype type and has some supportive features (e.g., memory impairment), although she does have some less characteristic findings in terms of surface dyslexia and the extent of her comprehension difficulties, which may make this an unclassifiable phenotype as per current criteria.   Imaging was ultimately completed in August 2022 confirming PPA, stating "Atrophy of the left hemisphere compared to the right, most advanced in the temporal lobe with lesser involvement in the frontal and parietal region. Very little if any gliosis. This is presumed be a longstanding finding, possibly due to prenatal or perinatal insult." Dr. Gustavus Bryant report, subtyping PPA reads, "You most closely resemble the logopenic variant, which is often due to underlying Alzheimer's disease, although you have some features of the semantic variant as well (that is most often caused by TDP-43-C pathology but can also be caused by Alzheimer's among other things)."     Prior Functional Status   Cognitive/Linguistic Baseline Within functional limits    Type of Home House     Lives With Spouse    Available Support Family;Friend(s)    Vocation --  no longer working     Cognition   Overall Cognitive Status Impaired/Different from baseline   noted in neuropsych evaluation - executive function; no formal testing today re: cognition-communication   Behaviors --   impulsive? Extremely motivated.     Auditory Comprehension   Overall Auditory Comprehension Impaired   full eval to follow   Conversation Other (comment)   errors/decr'd auditory comprehension noted in simple conversation   EffectiveTechniques Extra processing time;Repetition;Stressing words;Visual/Gestural cues;Slowed speech   all will be helpful to compensate to incr pt's auditory  comprehension     Reading Comprehension   Reading Status --   not tested today due to time - will occur PRN     Verbal Expression   Overall Verbal Expression Impaired    Level of Generative/Spontaneous Verbalization Conversation   with frequent semantic paraphasias; variable awareness; some errors may have been due to pt attempts to compensate for her anomia. Today SLP approximates that pt's message intent was conveyed clearly approx 50-60% in simple/mod complex conversation   Naming Impairment    Confrontation --   4/30 on evens of [employer]   Other Naming Comments pt perseverated on "Christmas tree" on [place] for a short time, with awareness. When performing on [place], Deanna [name] produced frequent semantic self-cueing; SLP encouraged this to assist pt's production of the target word and to enable listener to assist pt in finding word in conversation    Other Verbal Expression Comments pt has already begun some personally relevant word lists, independently, and has been practicing these. SLP suggested to pt and husband that she should input these words with all means possible- written, reading, auditory, and speaking herself, in order to enhance neuroplasticity for word retrieval. Pt was writing text from chemistry textbooks and SLP suggested this might not be as practical as personally relevant word practice.      Standardized Assessments   Standardized Assessments  [place] Naming Test-2nd edition   4/30 with evens; significantly below WNL. Aphasia evaluation to follow                            SLP Education - 11/26/20 1715     Education Details how to maximize verbal expression practice at home, Constant Therapy, pt already doing much of what we would encourage to practice language at home    Person(s) Educated Patient;Spouse    Methods Explanation    Comprehension Verbalized understanding;Need further instruction              SLP Short Term Goals - 11/27/20  0901       SLP SHORT TERM GOAL #1   Title pt will develop a functional personally relevant word list and bring to ST 5 sessions    Time 5    Period Weeks    Status New    Target Date 01/02/21   extended one week due to holidays     SLP [place] #2   Title pt will track completion of a language home program, daily, for a segment of time (2 hours/day?) which works with her schedule    Time 5    Period Weeks    Target Date 01/02/21      SLP SHORT TERM GOAL #3   Title pt will demo awareness of expressive errors, 75% of the time, given a nonverbal cue, in 3 sessions    Time 5    Period Weeks    Status  New    Target Date 01/02/21      SLP SHORT TERM GOAL #4   Title pt will generate her target word provided min SLP or family assistance, in 50% of opportunities    Time 5    Period Weeks    Status New    Target Date 01/02/21      SLP SHORT TERM GOAL #5   Title pt will repeat and write words from her personally relevant word list with 80% accuracy, in 4 sessions    Time 5    Period Weeks    Status New    Target Date 01/02/21              SLP Long Term Goals - 11/27/20 0912       SLP LONG TERM GOAL #1   Title pt will repeat and write words from her personally relevant word list with 90%+ accuracy, in 4 sessions    Time 10    Period Weeks    Status New    Target Date 02/06/21      SLP LONG TERM GOAL #2   Title pt will demo awareness of expressive errors, 85% of the time, given a nonverbal cue, in 3 sessions    Time 10    Period Weeks    Status New    Target Date 02/06/21      SLP LONG TERM GOAL #3   Title pt will generate her target word provided min SLP or family assistance, in 80% of opportunities    Time 10    Period Weeks    Status New    Target Date 02/06/21      SLP LONG TERM GOAL #4   Title pt family will demo functional cueing strategies for pt expressive and receptive language, in 8 sessions    Time 10    Period Weeks    Status New    Target  Date 02/06/21      SLP LONG TERM GOAL #5   Title pt and family will report beginning to explore alternative/augmentative means of communication    Time 10    Period Weeks    Status New    Target Date 02/06/21              Plan - 11/27/20 0839     Clinical Impression Statement Deanna [name] presents today with expressive and receptive aphasia, diagnosed primary progressive aphasia logopenic or semantic type, by Dr. Melvyn Novas after brain imaging and neuropsych testing. Examples of pt's expressive aphasia are "helpening"/happening, "October"/August, "pretty whiled"/a while, and "started using some of"/added some of. Deanna [name] scored 4/30 (evens) on [place] -2, profoundly below WNL. Pt noted during test to have much self-cueing, semantically, and initial two phonemes (most often a consonant+subsequent vowel) phonemic cues were less/not helpful; once in three times, a longer phonemic cue was helpful (initial three phonemes). Husband states sometimes pt will respond with a questioning look after he has said or asked something, and he will then rephrase. Full aphasia eval (QAB) to follow. Pt has already begun her own personally relevant word list and is practicing these at home. She has started "[employer]" and SLP suggested pt also focus on "Constant Therapy" app as it is more language based and data-driven. Pt would benefit from skilled ST to maximize/preserve remaining language skills, develop a practical home program, and begin to plan for the future.    Speech Therapy Frequency 2x / week    Duration --  10 weeks   Treatment/Interventions SLP instruction and feedback;Cognitive reorganization;Internal/external aids;Environmental controls;Compensatory strategies;Patient/family education;Functional tasks;Language facilitation;Cueing hierarchy;Multimodal communcation approach    Potential to Achieve Goals Good    Potential Considerations Medical prognosis    SLP Home Exercise Plan Constant Therapy     Consulted and Agree with Plan of Care Patient;Family member/caregiver    Family Member Consulted husband             Patient will benefit from skilled therapeutic intervention in order to improve the following deficits and impairments:   Aphasia    Problem List Patient Active Problem List   Diagnosis Date Noted   Aphasia 08/05/2020   Neurocognitive disorder 08/05/2020   Elevated BP without diagnosis of hypertension 05/23/2020   Word finding difficulty 05/23/2020   Expressive speech disorder 05/23/2020   Acute pain of right knee 12/10/2017   Physical exam 09/16/2017   Obesity (BMI 30.0-34.9) 09/16/2017    Deanna [name] ,MS, [place]  11/27/2020, 9:17 AM  [facility] [address], [place], [place], [address] Phone: [phone]   Fax:  [phone]  Name: Deanna [name] MRN: [id number] Date of Birth: July 02, 1960

## 2020-11-28 ENCOUNTER — Telehealth: Payer: Self-pay | Admitting: Neurology

## 2020-11-28 NOTE — Telephone Encounter (Signed)
Patient's spouse [name] called to check on the status of a referral to [facility] memory clinic, they've not heard from them for scheduling yet.

## 2020-11-28 NOTE — Therapy (Signed)
[facility] [address], [place], [place], [address] Phone: [phone]   Fax:  [phone]  Speech Language Pathology Treatment  Patient Details  Name: Deanna [name] MRN: [id number] Date of Birth: 11-Feb-1960 Referring Provider (SLP): Narda Amber, MD   Encounter Date: 11/27/2020   End of Session - 11/28/20 0835     Visit Number 2    Number of Visits 17    Date for SLP Re-Evaluation 01/30/21   added approx one week due to holidays   SLP Start Time 1534    SLP Stop Time  1622    SLP Time Calculation (min) 48 min    Activity Tolerance Patient tolerated treatment well             Past Medical History:  Diagnosis Date   Allergy    Chicken pox    Frequent headaches    Migraine    Urinary tract infection     Past Surgical History:  Procedure Laterality Date   [place]    There were no vitals filed for this visit.   Subjective Assessment - 11/28/20 0819     Subjective "I met my husband. Yeah. We were meeting and met in - I can't think of the name. We met."    Currently in Pain? No/denies                   ADULT SLP TREATMENT - 11/28/20 0821       General Information   Behavior/Cognition Alert;Cooperative;Pleasant mood      Treatment Provided   Treatment provided Cognitive-Linquistic      Cognitive-Linquistic Treatment   Treatment focused on Aphasia    Skilled Treatment SLP administered the Quick Aphasia Battery (QAB) with pt today, however3 minute language sample was lost to tech issues so score will have to be reported next session after another language sample can be taken and analyzed. Pt, generally, with reduced mean length of utterance and usual use of fillers, vague speech, and perseveration of thought- often using the words given to her in the prompt. Receptive language also shown as deficit; Pt would often answer a yes/no question with only one concept  understood (e.g., "If I have gone outside, am I still inside?" Pt: "yes, I'm inside."). SLP modeled written cues as assisting pt in simple conversation. Pt req'd max cues to name a favorite singer of hers Proofreader). SLP encouraged pt/husband to put this term on her personally relevant word list, and SLP provided pt/husband with the handout/s from [place] for personally relevant word lists. SLP discussed/educated pt and husband on voice banking, [employer] introduction, more home tasks. Husband [name]) shared that using all means of inputting linguistic info (written, spoken, auditory, reading) was helpful and provided example.      Assessment / Recommendations / Plan   Plan Continue with current plan of care      Progression Toward Goals   Progression toward goals Progressing toward goals              SLP Education - 11/28/20 0834     Education Details see daily note for details    Person(s) Educated Patient;Spouse    Methods Explanation;Demonstration    Comprehension Verbalized understanding;Returned demonstration;Need further instruction   pt may need further instruction             SLP Short Term Goals - 11/28/20 2774  SLP SHORT TERM GOAL #1   Title pt will develop a functional personally relevant word list and bring to ST 5 sessions    Time 5    Period Weeks    Status On-going    Target Date 01/02/21   extended one week due to holidays     SLP [place] #2   Title pt will track completion of a language home program, daily, for a segment of time (2 hours/day?) which works with her schedule    Time 5    Period Weeks    Status On-going    Target Date 01/02/21      SLP SHORT TERM GOAL #3   Title pt will demo awareness of expressive errors, 75% of the time, given a nonverbal cue, in 3 sessions    Time 5    Period Weeks    Status On-going    Target Date 01/02/21      SLP SHORT TERM GOAL #4   Title pt will generate her target word provided min SLP or  family assistance, in 50% of opportunities    Time 5    Period Weeks    Status On-going    Target Date 01/02/21      SLP SHORT TERM GOAL #5   Title pt will repeat and write words from her personally relevant word list with 80% accuracy, in 4 sessions    Time 5    Period Weeks    Status On-going    Target Date 01/02/21              SLP Long Term Goals - 11/28/20 0837       SLP LONG TERM GOAL #1   Title pt will repeat and write words from her personally relevant word list with 90%+ accuracy, in 4 sessions    Time 10    Period Weeks    Status On-going      SLP LONG TERM GOAL #2   Title pt will demo awareness of expressive errors, 85% of the time, given a nonverbal cue, in 3 sessions    Time 10    Period Weeks    Status On-going      SLP LONG TERM GOAL #3   Title pt will generate her target word provided min SLP or family assistance, in 80% of opportunities    Time 10    Period Weeks    Status On-going      SLP LONG TERM GOAL #4   Title pt family will demo functional cueing strategies for pt expressive and receptive language, in 8 sessions    Time 10    Period Weeks    Status On-going      SLP LONG TERM GOAL #5   Title pt and family will report beginning to explore alternative/augmentative means of communication    Time 10    Period Weeks    Status On-going              Plan - 11/28/20 0835     Clinical Impression Statement Deanna [name] cont to present today with expressive and receptive aphasia, diagnosed primary progressive aphasia logopenic or semantic type, by Dr. Melvyn Novas after brain imaging and neuropsych testing.  Full aphasia eval (QAB) was completed today however language sample was lost due to technological problem (digital recorder no longer operable). Pt would cont to benefit from skilled ST to maximize/preserve remaining language skills, develop a practical home program, and begin to plan for  the future.    Speech Therapy Frequency 2x / week     Duration --   10 weeks   Treatment/Interventions SLP instruction and feedback;Cognitive reorganization;Internal/external aids;Environmental controls;Compensatory strategies;Patient/family education;Functional tasks;Language facilitation;Cueing hierarchy;Multimodal communcation approach    Potential to Achieve Goals Good    Potential Considerations Medical prognosis             Patient will benefit from skilled therapeutic intervention in order to improve the following deficits and impairments:   Aphasia  Cognitive communication deficit    Problem List Patient Active Problem List   Diagnosis Date Noted   Aphasia 08/05/2020   Neurocognitive disorder 08/05/2020   Elevated BP without diagnosis of hypertension 05/23/2020   Word finding difficulty 05/23/2020   Expressive speech disorder 05/23/2020   Acute pain of right knee 12/10/2017   Physical exam 09/16/2017   Obesity (BMI 30.0-34.9) 09/16/2017    Lamis Behrmann ,MS, [place]  11/28/2020, 8:38 AM  [facility] [address] [place], [place], [address] Phone: [phone]   Fax:  [phone]   Name: Deanna [name] MRN: [id number] Date of Birth: 03/21/1960

## 2020-11-28 NOTE — Telephone Encounter (Signed)
Pt said he is returning a call to someone

## 2020-11-28 NOTE — Patient Instructions (Signed)
Personally relevant word list handouts [employer] info (Above provided to pt/husband)

## 2020-11-28 NOTE — Telephone Encounter (Signed)
I have faxed over the referral. I will check my paperwork when I have a chance. Thank you

## 2020-12-02 NOTE — Telephone Encounter (Signed)
[facility] at [phone] and was informed that they have received patients referral and patient may call for an appointment at [phone].  Called patient 8:58 pm and left a detailed message per DPR letting patient know that [facility] has received her referral and provided her with their phone number [phone] to schedule her appt.

## 2020-12-08 ENCOUNTER — Ambulatory Visit: Payer: 59

## 2020-12-08 ENCOUNTER — Other Ambulatory Visit: Payer: Self-pay

## 2020-12-08 DIAGNOSIS — R41841 Cognitive communication deficit: Secondary | ICD-10-CM

## 2020-12-08 DIAGNOSIS — R4701 Aphasia: Secondary | ICD-10-CM | POA: Diagnosis not present

## 2020-12-08 NOTE — Therapy (Signed)
[facility] [address] [place], [place], [address] Phone: [phone]   Fax:  [phone]  Speech Language Pathology Treatment  Patient Details  Name: CONLEY PAWLING MRN: [id number] Date of Birth: [birth year] Referring Provider (SLP): Narda Amber, MD   Encounter Date: 12/08/2020   End of Session - 12/08/20 0925     Visit Number 3    Number of Visits 17    Date for SLP Re-Evaluation 01/30/21   added approx one week due to holidays   SLP Start Time 0804    SLP Stop Time  0846    SLP Time Calculation (min) 42 min    Activity Tolerance Patient tolerated treatment well             Past Medical History:  Diagnosis Date   Allergy    Chicken pox    Frequent headaches    Migraine    Urinary tract infection     Past Surgical History:  Procedure Laterality Date   [place]    There were no vitals filed for this visit.   Subjective Assessment - 12/08/20 0805     Subjective "Yeah it was good. Had the week now we're back home."    Currently in Pain? No/denies                   ADULT SLP TREATMENT - 12/08/20 0805       General Information   Behavior/Cognition Alert;Cooperative;Pleasant mood      Treatment Provided   Treatment provided Cognitive-Linquistic      Cognitive-Linquistic Treatment   Treatment focused on Aphasia    Skilled Treatment SLP educated pt/Bob on semantic feature analysis and provided example with pt. SLP used pt's family members as an example of what pt and husband can do at home with other subjects/topics to preserve pt's current linguistic ability for as long as possible. SLP re-introduced primary progressive aphasia (PPA) [employer] group and husband thanked SLP for doing this and shared that given Celisse's situation that they are choosing to currently focus on pt's work with [employer] and other home tasks. SLP used touch cues and rephrasing to alert pt  to errors in comprehension when pt did not understand SLP questions.      Assessment / Recommendations / Plan   Plan Continue with current plan of care      Progression Toward Goals   Progression toward goals Progressing toward goals              SLP Education - 12/08/20 0924     Education Details Semantic Feature Analysis    Person(s) Educated Patient;Spouse    Methods Explanation;Demonstration;Verbal cues;Handout    Comprehension Verbalized understanding;Returned demonstration;Verbal cues required;Need further instruction              SLP Short Term Goals - 12/08/20 0926       SLP SHORT TERM GOAL #1   Title pt will develop a functional personally relevant word list and bring to ST 5 sessions    Baseline 12-08-20    Time 5    Period Weeks    Status On-going    Target Date 01/02/21   extended one week due to holidays     SLP [place] #2   Title pt will track completion of a language home program, daily, for a segment of time (2 hours/day?) which works with her schedule over  3 sessions    Baseline 12-08-20    Time 5    Period Weeks    Status Revised    Target Date 01/02/21      SLP SHORT TERM GOAL #3   Title pt will demo awareness of expressive errors, 75% of the time, given a nonverbal cue, in 3 sessions    Time 5    Period Weeks    Status On-going    Target Date 01/02/21      SLP SHORT TERM GOAL #4   Title pt will generate her target word provided min SLP or family assistance, in 50% of opportunities in 2 sessions    Time 5    Period Weeks    Status Revised    Target Date 01/02/21      SLP SHORT TERM GOAL #5   Title pt will repeat and write words from her personally relevant word list with 80% accuracy, in 4 sessions    Time 5    Period Weeks    Status On-going    Target Date 01/02/21              SLP Long Term Goals - 12/08/20 0927       SLP LONG TERM GOAL #1   Title pt will repeat and write words from her personally relevant word  list with 90%+ accuracy, in 4 sessions    Time 10    Period Weeks    Status On-going    Target Date 02/06/21      SLP LONG TERM GOAL #2   Title pt will demo awareness of expressive errors, 85% of the time, given a nonverbal cue, in 3 sessions    Time 10    Period Weeks    Status On-going    Target Date 02/06/21      SLP LONG TERM GOAL #3   Title pt will generate her target word provided min SLP or family assistance, in 80% of opportunities    Time 10    Period Weeks    Status On-going    Target Date 02/06/21      SLP LONG TERM GOAL #4   Title pt family will demo functional cueing strategies for pt expressive and receptive language, in 8 sessions    Time 10    Period Weeks    Status On-going    Target Date 02/06/21      SLP LONG TERM GOAL #5   Title pt and family will report beginning to explore alternative/augmentative means of communication    Time 10    Period Weeks    Status On-going    Target Date 02/06/21              Plan - 12/08/20 0925     Clinical Impression Statement Trudy Kory cont to present today with expressive and receptive aphasia, diagnosed primary progressive aphasia logopenic or semantic type, by Dr. Melvyn Novas after brain imaging and neuropsych testing.  See "skilled intervention" for details of today's session. Pt would cont to benefit from skilled ST to maximize/preserve remaining language skills, develop a practical home program, and begin to plan for the future.    Speech Therapy Frequency 2x / week    Duration --   10 weeks   Treatment/Interventions SLP instruction and feedback;Cognitive reorganization;Internal/external aids;Environmental controls;Compensatory strategies;Patient/family education;Functional tasks;Language facilitation;Cueing hierarchy;Multimodal communcation approach    Potential to Achieve Goals Good    Potential Considerations Medical prognosis  Patient will benefit from skilled therapeutic intervention in order to  improve the following deficits and impairments:   Aphasia  Cognitive communication deficit    Problem List Patient Active Problem List   Diagnosis Date Noted   Aphasia 08/05/2020   Neurocognitive disorder 08/05/2020   Elevated BP without diagnosis of hypertension 05/23/2020   Word finding difficulty 05/23/2020   Expressive speech disorder 05/23/2020   Acute pain of right knee 12/10/2017   Physical exam 09/16/2017   Obesity (BMI 30.0-34.9) 09/16/2017    Darsha Zumstein, [place] 12/08/2020, 9:29 AM  [facility] [address], [place], [place], [address] Phone: [phone]   Fax:  [phone]   Name: JANAIAH VETRANO MRN: [id number] Date of Birth: 22-Aug-1960

## 2020-12-08 NOTE — Patient Instructions (Signed)
Customer service manager

## 2020-12-10 ENCOUNTER — Other Ambulatory Visit: Payer: Self-pay

## 2020-12-10 ENCOUNTER — Ambulatory Visit: Payer: 59

## 2020-12-10 DIAGNOSIS — R4701 Aphasia: Secondary | ICD-10-CM

## 2020-12-10 DIAGNOSIS — R41841 Cognitive communication deficit: Secondary | ICD-10-CM

## 2020-12-10 NOTE — Patient Instructions (Signed)
   You did great today!  We worked on the [employer] (SFA) for "[employer]".  I was a little picky about the language you used. I used some written and spoken cues to help you be more exact with what you wrote down.  Keep working hard at home!! :)  The discount for [employer] subscription is below: As our way of saying thanks to the [employer] community, we are offering 20% off all new patient subscriptions.  Recent research provides decisive evidence that patients who practice [employer] 4 days per week realize the best results. This inspires [place] to get our app into as many patients' hands as possible.   Will you share this evidence and this offer with your patients?  Encourage your patients to subscribe by December 31 in order to enjoy 20% off [employer]! Please pass along the following link (and give them the username you signed them up with):  20% Coupon Code: [id number]

## 2020-12-10 NOTE — Therapy (Signed)
[facility] [address], [place], [place], [address] Phone: [phone]   Fax:  [phone]  Speech Language Pathology Treatment  Patient Details  Name: Deanna [name] MRN: [id number] Date of Birth: 03-19-1960 Referring Provider (SLP): Narda Amber, MD   Encounter Date: 12/10/2020   End of Session - 12/10/20 1131     Visit Number 4    Number of Visits 17    Date for SLP Re-Evaluation 01/30/21   added approx one week due to holidays   SLP Start Time 0847    SLP Stop Time  0930    SLP Time Calculation (min) 43 min    Activity Tolerance Patient tolerated treatment well             Past Medical History:  Diagnosis Date   Allergy    Chicken pox    Frequent headaches    Migraine    Urinary tract infection     Past Surgical History:  Procedure Laterality Date   [place]    There were no vitals filed for this visit.   Subjective Assessment - 12/10/20 0847     Subjective "It'a raining!"    Patient is accompained by: --   alone   Currently in Pain? No/denies                   ADULT SLP TREATMENT - 12/10/20 0847       General Information   Behavior/Cognition Alert;Cooperative;Pleasant mood      Treatment Provided   Treatment provided Cognitive-Linquistic      Cognitive-Linquistic Treatment   Treatment focused on Aphasia    Skilled Treatment Pt arrived today with homework completed with error-filled grammar/syntax. SLP congratulated pt on completing homework and reinforced the importance of completing homework. SLP worked with pt today with semantic feature analysis (SFA) with "[employer]". SLP provided consistent mod cues for using correct grammar in her written and spoken responses. Noted pt perseverated with "in the", and previous phrasing. Deanna [name] needed to copy SLP responses approx 25% of the time. SLP used written words for assistance with pt comprehension  throughout session. SLP provided Deanna [name] a 20% off discount code for [employer] subscriptions.      Assessment / Recommendations / Plan   Plan Continue with current plan of care      Progression Toward Goals   Progression toward goals Progressing toward goals              SLP Education - 12/10/20 1131     Education Details importance of homework, 20% off code    Person(s) Educated Patient    Methods Explanation;Handout    Comprehension Verbalized understanding              SLP Short Term Goals - 12/10/20 0837       SLP SHORT TERM GOAL #1   Title pt will develop a functional personally relevant word list and bring to ST 5 sessions    Baseline 12-08-20, 12-10-20    Time 5    Period Weeks    Status On-going    Target Date 01/02/21   extended one week due to holidays     SLP [place] #2   Title pt will track completion of a language home program, daily, for a segment of time (2 hours/day?) which works with her schedule over 3 sessions    Baseline 12-08-20, 12-10-20  Time 5    Period Weeks    Status Revised    Target Date 01/02/21      SLP SHORT TERM GOAL #3   Title pt will demo awareness of expressive errors, 75% of the time, given a nonverbal cue, in 3 sessions    Time 5    Period Weeks    Status On-going    Target Date 01/02/21      SLP SHORT TERM GOAL #4   Title pt will generate her target word provided min SLP or family assistance, in 50% of opportunities in 2 sessions    Time 5    Period Weeks    Status Revised    Target Date 01/02/21      SLP SHORT TERM GOAL #5   Title pt will repeat and write words from her personally relevant word list with 80% accuracy, in 4 sessions    Time 5    Period Weeks    Status On-going    Target Date 01/02/21              SLP Long Term Goals - 12/10/20 1133       SLP LONG TERM GOAL #1   Title pt will repeat and write words from her personally relevant word list with 90%+ accuracy, in 4 sessions     Time 10    Period Weeks    Status On-going    Target Date 02/06/21      SLP LONG TERM GOAL #2   Title pt will demo awareness of expressive errors, 85% of the time, given a nonverbal cue, in 3 sessions    Time 10    Period Weeks    Status On-going    Target Date 02/06/21      SLP LONG TERM GOAL #3   Title pt will generate her target word provided min SLP or family assistance, in 80% of opportunities    Time 10    Period Weeks    Status On-going    Target Date 02/06/21      SLP LONG TERM GOAL #4   Title pt family will demo functional cueing strategies for pt expressive and receptive language, in 8 sessions    Time 10    Period Weeks    Status On-going    Target Date 02/06/21      SLP LONG TERM GOAL #5   Title pt and family will report beginning to explore alternative/augmentative means of communication    Time 10    Period Weeks    Status On-going    Target Date 02/06/21              Plan - 12/10/20 1131     Clinical Impression Statement Deanna [name] cont to present today with expressive and receptive aphasia, diagnosed primary progressive aphasia logopenic or semantic type, by Dr. Melvyn Novas after brain imaging and neuropsych testing.  SLP and pt worked through a SFA for "[employer]" today; See "skilled intervention" for more details of today's session. Pt would cont to benefit from skilled ST to maximize/preserve remaining language skills, develop a practical home program, and begin to plan for the future.    Speech Therapy Frequency 2x / week    Duration --   10 weeks   Treatment/Interventions SLP instruction and feedback;Cognitive reorganization;Internal/external aids;Environmental controls;Compensatory strategies;Patient/family education;Functional tasks;Language facilitation;Cueing hierarchy;Multimodal communcation approach    Potential to Achieve Goals Good    Potential Considerations Medical prognosis  Patient will benefit from skilled therapeutic  intervention in order to improve the following deficits and impairments:   Aphasia  Cognitive communication deficit    Problem List Patient Active Problem List   Diagnosis Date Noted   Aphasia 08/05/2020   Neurocognitive disorder 08/05/2020   Elevated BP without diagnosis of hypertension 05/23/2020   Word finding difficulty 05/23/2020   Expressive speech disorder 05/23/2020   Acute pain of right knee 12/10/2017   Physical exam 09/16/2017   Obesity (BMI 30.0-34.9) 09/16/2017    [place], [place] 12/10/2020, 11:34 AM  [facility] [address], [place], [place], [address] Phone: [phone]   Fax:  [phone]   Name: Deanna [name] MRN: [id number] Date of Birth: 05-17-1960

## 2020-12-15 ENCOUNTER — Other Ambulatory Visit: Payer: Self-pay

## 2020-12-15 ENCOUNTER — Ambulatory Visit: Payer: 59 | Attending: Neurology

## 2020-12-15 DIAGNOSIS — R41841 Cognitive communication deficit: Secondary | ICD-10-CM | POA: Diagnosis present

## 2020-12-15 DIAGNOSIS — R4701 Aphasia: Secondary | ICD-10-CM

## 2020-12-15 NOTE — Therapy (Signed)
[facility] [address] [place], [place], [address] Phone: [phone]   Fax:  [phone]  Speech Language Pathology Treatment  Patient Details  Name: Deanna [name] MRN: [id number] Date of Birth: 07-24-1960 Referring Provider (SLP): Narda Amber, MD   Encounter Date: 12/15/2020   End of Session - 12/15/20 0953     Visit Number 5    Number of Visits 17    Date for SLP Re-Evaluation 01/30/21   added approx one week due to holidays   SLP Start Time 0805    SLP Stop Time  3953    SLP Time Calculation (min) 42 min    Activity Tolerance Patient tolerated treatment well             Past Medical History:  Diagnosis Date   Allergy    Chicken pox    Frequent headaches    Migraine    Urinary tract infection     Past Surgical History:  Procedure Laterality Date   [place]    There were no vitals filed for this visit.   Subjective Assessment - 12/15/20 0934     Subjective "It's cold"    Currently in Pain? No/denies                   ADULT SLP TREATMENT - 12/15/20 0947       General Information   Behavior/Cognition Alert;Cooperative;Pleasant mood      Treatment Provided   Treatment provided Cognitive-Linquistic      Cognitive-Linquistic Treatment   Treatment focused on Aphasia    Skilled Treatment Discussed with pt and husband best way to structure homework as SLP learned that pt's spoken langauge at home has less errors than her written language - SLP suggested more correction with spoken and less with pt's written language. REview of pt's homework was completed and pt cont to work every day with speaking, writing, reading, hearing. [name] stated he added "touch it" and "taste it" to pertinent items. SLP took language sample from pt today and pt with MLU of 7.81.      Assessment / Recommendations / Plan   Plan Continue with current plan of care      Progression Toward  Goals   Progression toward goals Progressing toward goals              SLP Education - 12/15/20 1653     Education Details spoken practice better to correct than written practice    Person(s) Educated Patient    Methods Explanation    Comprehension Verbalized understanding;Need further instruction              SLP Short Term Goals - 12/15/20 0954       SLP SHORT TERM GOAL #1   Title pt will develop a functional personally relevant word list and bring to ST 5 sessions    Baseline 12-08-20, 12-10-20, 12-15-20    Time 5    Period Weeks    Status On-going    Target Date 01/02/21   extended one week due to holidays     SLP [place] #2   Title pt will track completion of a language home program, daily, for a segment of time (2 hours/day?) which works with her schedule over 3 sessions    Baseline 12-08-20, 12-10-20    Status Achieved    Target Date 01/02/21      SLP  SHORT TERM GOAL #3   Title pt will demo awareness of expressive errors, 50% of the time, given a nonverbal cue, in 3 sessions    Time 5    Period Weeks    Status Revised    Target Date 01/02/21      SLP SHORT TERM GOAL #4   Title pt will generate her target word provided min SLP or family assistance, in 50% of opportunities in 2 sessions    Time 5    Period Weeks    Status Revised    Target Date 01/02/21      SLP SHORT TERM GOAL #5   Title pt will repeat and write words from her personally relevant word list with 80% accuracy, in 4 sessions    Time 5    Period Weeks    Status On-going    Target Date 01/02/21              SLP Long Term Goals - 12/15/20 0955       SLP LONG TERM GOAL #1   Title pt will repeat and write words from her personally relevant word list with 90%+ accuracy, in 4 sessions    Time 10    Period Weeks    Status On-going    Target Date 02/06/21      SLP LONG TERM GOAL #2   Title pt will demo awareness of expressive errors, 85% of the time, given a nonverbal cue,  in 3 sessions    Time 10    Period Weeks    Status On-going    Target Date 02/06/21      SLP LONG TERM GOAL #3   Title pt will generate her target word provided min SLP or family assistance, in 80% of opportunities    Time 10    Period Weeks    Status On-going    Target Date 02/06/21      SLP LONG TERM GOAL #4   Title pt family will demo functional cueing strategies for pt expressive and receptive language, in 8 sessions    Time 10    Period Weeks    Status On-going    Target Date 02/06/21      SLP LONG TERM GOAL #5   Title pt and family will report beginning to explore alternative/augmentative means of communication    Time 10    Period Weeks    Status On-going    Target Date 02/06/21              Plan - 12/15/20 0954     Clinical Impression Statement Geoffrey cont to present today with expressive and receptive aphasia, diagnosed primary progressive aphasia logopenic or semantic type, by Dr. Melvyn Novas after brain imaging and neuropsych testing.  See "skilled intervention" for more details of today's session. Pt would cont to benefit from skilled ST to maximize/preserve remaining language skills, develop a practical home program, and begin to plan for the future.    Speech Therapy Frequency 2x / week    Duration --   10 weeks   Treatment/Interventions SLP instruction and feedback;Cognitive reorganization;Internal/external aids;Environmental controls;Compensatory strategies;Patient/family education;Functional tasks;Language facilitation;Cueing hierarchy;Multimodal communcation approach    Potential to Achieve Goals Good    Potential Considerations Medical prognosis             Patient will benefit from skilled therapeutic intervention in order to improve the following deficits and impairments:   Aphasia  Cognitive communication deficit    Problem List Patient  Active Problem List   Diagnosis Date Noted   Aphasia 08/05/2020   Neurocognitive disorder 08/05/2020    Elevated BP without diagnosis of hypertension 05/23/2020   Word finding difficulty 05/23/2020   Expressive speech disorder 05/23/2020   Acute pain of right knee 12/10/2017   Physical exam 09/16/2017   Obesity (BMI 30.0-34.9) 09/16/2017    Jeramia Saleeby, [place] 12/15/2020, 4:54 PM  [facility] [address], [place], [place], [address] Phone: [phone]   Fax:  [phone]   Name: Deanna [name] MRN: [id number] Date of Birth: 1960-10-04

## 2020-12-17 ENCOUNTER — Other Ambulatory Visit: Payer: Self-pay

## 2020-12-17 ENCOUNTER — Ambulatory Visit: Payer: 59

## 2020-12-17 DIAGNOSIS — R4701 Aphasia: Secondary | ICD-10-CM

## 2020-12-17 DIAGNOSIS — R41841 Cognitive communication deficit: Secondary | ICD-10-CM

## 2020-12-17 NOTE — Therapy (Signed)
[facility] [address] [place], [place], [address] Phone: [phone]   Fax:  [phone]  Speech Language Pathology Treatment  Patient Details  Name: REANNAH TOTTEN MRN: [id number] Date of Birth: [birth year] Referring Provider (SLP): Narda Amber, MD   Encounter Date: 12/17/2020   End of Session - 12/17/20 1019     Visit Number 6    Number of Visits 17    Date for SLP Re-Evaluation 01/30/21   added approx one week due to holidays   SLP Start Time 0842    SLP Stop Time  0923    SLP Time Calculation (min) 41 min    Activity Tolerance Patient tolerated treatment well             Past Medical History:  Diagnosis Date   Allergy    Chicken pox    Frequent headaches    Migraine    Urinary tract infection     Past Surgical History:  Procedure Laterality Date   [place]    There were no vitals filed for this visit.   Subjective Assessment - 12/17/20 0954     Subjective "It's still raining."    Currently in Pain? No/denies                   ADULT SLP TREATMENT - 12/17/20 0955       General Information   Behavior/Cognition Alert;Cooperative;Pleasant mood      Treatment Provided   Treatment provided Cognitive-Linquistic      Cognitive-Linquistic Treatment   Treatment focused on Aphasia    Skilled Treatment Pt read and wrote some of her personally relevant words from her word list with 85% accuracy on first attempt. Semantic feature analysis completed with "coffee maker" and Telma req'd mod A usually, to demonstrate understanding of each category. She repeated previous answers, and used words she had generated from another task for "coffee" without awareness. Lastly, pt told me pertinent items in her bedroom and had very good success drawing items. SLP provided mod cues usually for word generation and used letter cues which, paired with her drawing cues were helpful for pt  to name items.      Assessment / Recommendations / Plan   Plan Continue with current plan of care      Progression Toward Goals   Progression toward goals Progressing toward goals                SLP Short Term Goals - 12/17/20 1020       SLP SHORT TERM GOAL #1   Title pt will develop a functional personally relevant word list and bring to ST 5 sessions    Baseline 12-08-20, 12-10-20, 12-15-20, 12-17-20    Time 5    Period Weeks    Status On-going    Target Date 01/02/21   extended one week due to holidays     SLP [place] #2   Title pt will track completion of a language home program, daily, for a segment of time (2 hours/day?) which works with her schedule over 3 sessions    Baseline 12-08-20, 12-10-20    Status Achieved    Target Date 01/02/21      SLP SHORT TERM GOAL #3   Title pt will demo awareness of expressive errors, 50% of the time, given a nonverbal cue, in 3 sessions    Time 5  Period Weeks    Status On-going    Target Date 01/02/21      SLP SHORT TERM GOAL #4   Title pt will generate her target word provided min SLP or family assistance, in 50% of opportunities in 2 sessions    Baseline 12-17-20    Time 5    Period Weeks    Status On-going    Target Date 01/02/21      SLP SHORT TERM GOAL #5   Title pt will repeat and write words from her personally relevant word list with 80% accuracy, in 4 sessions    Baseline 12-17-20    Time 5    Period Weeks    Status On-going    Target Date 01/02/21              SLP Long Term Goals - 12/17/20 1020       SLP LONG TERM GOAL #1   Title pt will repeat and write words from her personally relevant word list with 90%+ accuracy, in 4 sessions    Time 10    Period Weeks    Status On-going      SLP LONG TERM GOAL #2   Title pt will demo awareness of expressive errors, 75% of the time, given a nonverbal cue, in 3 sessions    Time 10    Period Weeks    Status Revised      SLP LONG TERM GOAL #3    Title pt will generate her target word provided min SLP or family assistance, in 70% of opportunities    Time 10    Period Weeks    Status Revised      SLP LONG TERM GOAL #4   Title pt family will demo functional cueing strategies for pt expressive and receptive language, in 8 sessions    Time 10    Period Weeks    Status On-going      SLP LONG TERM GOAL #5   Title pt and family will report beginning to explore alternative/augmentative means of communication    Time 10    Period Weeks    Status On-going              Plan - 12/17/20 1019     Clinical Impression Statement Alena cont to present today with expressive and receptive aphasia, diagnosed primary progressive aphasia logopenic or semantic type, by Dr. Melvyn Novas after brain imaging and neuropsych testing.  See "skilled intervention" for more details of today's session. Pt would cont to benefit from skilled ST to maximize/preserve remaining language skills, develop a practical home program, and begin to plan for the future.    Speech Therapy Frequency 2x / week    Duration --   10 weeks   Treatment/Interventions SLP instruction and feedback;Cognitive reorganization;Internal/external aids;Environmental controls;Compensatory strategies;Patient/family education;Functional tasks;Language facilitation;Cueing hierarchy;Multimodal communcation approach    Potential to Achieve Goals Good    Potential Considerations Medical prognosis             Patient will benefit from skilled therapeutic intervention in order to improve the following deficits and impairments:   Aphasia  Cognitive communication deficit    Problem List Patient Active Problem List   Diagnosis Date Noted   Aphasia 08/05/2020   Neurocognitive disorder 08/05/2020   Elevated BP without diagnosis of hypertension 05/23/2020   Word finding difficulty 05/23/2020   Expressive speech disorder 05/23/2020   Acute pain of right knee 12/10/2017   Physical exam  09/16/2017  Obesity (BMI 30.0-34.9) 09/16/2017    Ethan Kasperski, [place] 12/17/2020, 10:21 AM  [facility] [address] [place], [place], [address] Phone: [phone]   Fax:  [phone]   Name: VIANEY CANIGLIA MRN: [id number] Date of Birth: 1960-11-25

## 2020-12-22 ENCOUNTER — Ambulatory Visit: Payer: 59

## 2020-12-29 ENCOUNTER — Ambulatory Visit: Payer: 59

## 2020-12-29 ENCOUNTER — Other Ambulatory Visit: Payer: Self-pay

## 2020-12-29 DIAGNOSIS — R41841 Cognitive communication deficit: Secondary | ICD-10-CM

## 2020-12-29 DIAGNOSIS — R4701 Aphasia: Secondary | ICD-10-CM

## 2020-12-29 NOTE — Therapy (Signed)
[facility] [address], [place], [place], [address] Phone: [phone]   Fax:  [phone]  Speech Language Pathology Treatment  Patient Details  Name: Deanna [name] MRN: [id number] Date of Birth: [birth year] Referring Provider (SLP): Narda Amber, MD   Encounter Date: 12/29/2020   End of Session - 12/29/20 1359     Visit Number 7    Number of Visits 17    Date for SLP Re-Evaluation 01/30/21   added approx one week due to holidays   SLP Start Time 0804    SLP Stop Time  0845    SLP Time Calculation (min) 41 min    Activity Tolerance Patient tolerated treatment well             Past Medical History:  Diagnosis Date   Allergy    Chicken pox    Frequent headaches    Migraine    Urinary tract infection     Past Surgical History:  Procedure Laterality Date   [place]    There were no vitals filed for this visit.   Subjective Assessment - 12/29/20 0804     Subjective Pt had bloody nose yesterday from lt nostril. Deanna [name] states minor but going to ENT to have it checked.    Currently in Pain? No/denies                   ADULT SLP TREATMENT - 12/29/20 0807       General Information   Behavior/Cognition Alert;Cooperative;Pleasant mood      Treatment Provided   Treatment provided Cognitive-Linquistic      Cognitive-Linquistic Treatment   Treatment focused on Aphasia    Skilled Treatment Today SLP assisted pt with error awareness primarily using written cues. With extra time pt realized her error in spoken language and req'd mod-max cues each time for correct production. SLP assisted pt reviewing her relevant personal word list. She used appropriate and logical compensatory strategies successfully, using her phone and her practice notebook for written cues. She continues to practice each day, Deanna [name] reports 3-4 hours/day. Her explanation of their time at daughter's/son in [place]  home with their new granddaughter was definitely functional. SLP highlighted cueing techniques/practice structure that Deanna [name] acknowledged was helpful for him to assist pt at home. Deanna [name] was observed today to cue Deanna [name] appropriately and effectively x2 during anomic/expressive language incidents.      Assessment / Recommendations / Plan   Plan Continue with current plan of care      Progression Toward Goals   Progression toward goals Progressing toward goals              SLP Education - 12/29/20 1359     Education Details cueing technique, practice structure    Person(s) Educated Patient;Spouse    Methods Explanation;Demonstration    Comprehension Verbalized understanding              SLP Short Term Goals - 12/29/20 1400       SLP SHORT TERM GOAL #1   Title pt will develop a functional personally relevant word list and bring to ST 5 sessions    Baseline 12-08-20, 12-10-20, 12-15-20, 12-17-20, 12-29-20    Time 5    Period Weeks    Status On-going    Target Date 01/02/21   extended one week due to holidays     SLP Deanna [name] #2   Title pt  will track completion of a language home program, daily, for a segment of time (2 hours/day?) which works with her schedule over 3 sessions    Baseline 12-08-20, 12-10-20    Status Achieved    Target Date 01/02/21      SLP SHORT TERM GOAL #3   Title pt will demo awareness of expressive errors, 50% of the time, given a nonverbal cue, in 3 sessions    Time 5    Period Weeks    Status On-going    Target Date 01/02/21      SLP SHORT TERM GOAL #4   Title pt will generate her target word provided min SLP or family assistance, in 50% of opportunities in 2 sessions    Baseline 12-17-20    Time 5    Period Weeks    Status On-going    Target Date 01/02/21      SLP SHORT TERM GOAL #5   Title pt will repeat and write words from her personally relevant word list with 80% accuracy, in 4 sessions    Baseline 12-17-20, 12-29-20    Time 5     Period Weeks    Status On-going    Target Date 01/02/21              SLP Long Term Goals - 12/29/20 1401       SLP LONG TERM GOAL #1   Title pt will repeat and write words from her personally relevant word list with 90%+ accuracy, in 4 sessions    Time 10    Period Weeks    Status On-going    Target Date 02/06/21      SLP LONG TERM GOAL #2   Title pt will demo awareness of expressive errors, 60% of the time, given a nonverbal cue, in 3 sessions    Time 10    Period Weeks    Status Revised    Target Date 02/06/21      SLP LONG TERM GOAL #3   Title pt will generate her target word provided min-mod SLP or family assistance, in 70% of opportunities    Time 10    Period Weeks    Status Revised    Target Date 02/06/21      SLP LONG TERM GOAL #4   Title pt family will demo functional cueing strategies for pt expressive and receptive language, in 8 sessions    Baseline 12-29-20    Time 10    Period Weeks    Status On-going    Target Date 02/06/21      SLP LONG TERM GOAL #5   Title pt and family will report beginning to explore alternative/augmentative means of communication    Time 10    Period Weeks    Status On-going              Plan - 12/29/20 1359     Clinical Impression Statement Deanna [name] cont to present today with expressive and receptive aphasia, diagnosed primary progressive aphasia logopenic or semantic type, by Dr. Melvyn Novas after brain imaging and neuropsych testing. Pt using compensatory strategies successfully and spontaneously. See "skilled intervention" for more details of today's session. Pt would cont to benefit from skilled ST to maximize/preserve remaining language skills, develop a practical home program, and begin to plan for the future.    Speech Therapy Frequency 2x / week    Duration --   10 weeks   Treatment/Interventions SLP instruction and feedback;Cognitive reorganization;Internal/external aids;Environmental controls;Compensatory  strategies;Patient/family education;Functional tasks;Language facilitation;Cueing hierarchy;Multimodal communcation approach    Potential to Achieve Goals Good    Potential Considerations Medical prognosis             Patient will benefit from skilled therapeutic intervention in order to improve the following deficits and impairments:   Aphasia  Cognitive communication deficit    Problem List Patient Active Problem List   Diagnosis Date Noted   Aphasia 08/05/2020   Neurocognitive disorder 08/05/2020   Elevated BP without diagnosis of hypertension 05/23/2020   Word finding difficulty 05/23/2020   Expressive speech disorder 05/23/2020   Acute pain of right knee 12/10/2017   Physical exam 09/16/2017   Obesity (BMI 30.0-34.9) 09/16/2017    [place], [place] 12/29/2020, 2:03 PM  [facility] [address] [place], [place], [address] Phone: [phone]   Fax:  [phone]   Name: Deanna [name] MRN: [id number] Date of Birth: 1960/04/16

## 2020-12-31 ENCOUNTER — Ambulatory Visit: Payer: 59

## 2020-12-31 ENCOUNTER — Other Ambulatory Visit: Payer: Self-pay

## 2020-12-31 DIAGNOSIS — R4701 Aphasia: Secondary | ICD-10-CM

## 2020-12-31 DIAGNOSIS — R41841 Cognitive communication deficit: Secondary | ICD-10-CM

## 2021-01-01 NOTE — Therapy (Signed)
[facility] [address] [place], [place], [address] Phone: [phone]   Fax:  [phone]  Speech Language Pathology Treatment  Patient Details  Name: Deanna [name] MRN: [id number] Date of Birth: 1960-10-15 Referring Provider (SLP): Narda Amber, MD   Encounter Date: 12/31/2020   End of Session - 01/01/21 1030     Visit Number 8    Number of Visits 17    Date for SLP Re-Evaluation 01/30/21   added approx one week due to holidays   SLP Start Time 0849    SLP Stop Time  0930    SLP Time Calculation (min) 41 min    Activity Tolerance Patient tolerated treatment well             Past Medical History:  Diagnosis Date   Allergy    Chicken pox    Frequent headaches    Migraine    Urinary tract infection     Past Surgical History:  Procedure Laterality Date   [place]    There were no vitals filed for this visit.   Subjective Assessment - 12/31/20 0852     Subjective Pt has been working on check writing (writing out numbers).    Currently in Pain? No/denies                   ADULT SLP TREATMENT - 01/01/21 0001       General Information   Behavior/Cognition Alert;Cooperative;Pleasant mood      Treatment Provided   Treatment provided Cognitive-Linquistic      Cognitive-Linquistic Treatment   Treatment focused on Aphasia    Skilled Treatment Pt began talking about her daughter that she saw over the weekend (granddaughter), so SLP worked with pt on family relationships using her "family" paper she brought with her with family tree diagram on it. Pt was aware of errors <25% of the time and req'd mod-max cues consistently for correct family relationship during her explanations. SLP worked with pt on her constant therapy with alphabetizing 5 words, req'd mod A to slow down and use an alphabet list for assistance instead of just randomly putting words into the sequence -  possible highlight of impulsivity based in decr'd cognition?      Assessment / Recommendations / Plan   Plan Continue with current plan of care      Progression Toward Goals   Progression toward goals Progressing toward goals              SLP Education - 01/01/21 0959     Education Details Constant Therapy alphabetizing task    Person(s) Educated Patient    Methods Explanation;Demonstration    Comprehension Verbalized understanding;Returned demonstration;Verbal cues required;Need further instruction              SLP Short Term Goals - 01/01/21 [place] #1   Title pt will develop a functional personally relevant word list and bring to ST 5 sessions    Baseline 12-08-20, 12-10-20, 12-15-20, 12-17-20, 12-29-20    Time 5    Period Weeks    Status On-going    Target Date 01/02/21   extended one week due to holidays     SLP [place] #2   Title pt will track completion of a language home program, daily, for a segment of time (2 hours/day?) which works with her schedule  over 3 sessions    Baseline 12-08-20, 12-10-20    Status Achieved    Target Date 01/02/21      SLP SHORT TERM GOAL #3   Title pt will demo awareness of expressive errors, 50% of the time, given a nonverbal cue, in 3 sessions    Time 5    Period Weeks    Status On-going    Target Date 01/02/21      SLP SHORT TERM GOAL #4   Title pt will generate her target word provided min SLP or family assistance, in 50% of opportunities in 2 sessions    Baseline 12-17-20    Time 5    Period Weeks    Status On-going    Target Date 01/02/21      SLP SHORT TERM GOAL #5   Title pt will repeat and write words from her personally relevant word list with 80% accuracy, in 4 sessions    Baseline 12-17-20, 12-29-20    Time 5    Period Weeks    Status On-going    Target Date 01/02/21              SLP Long Term Goals - 01/01/21 1029       SLP LONG TERM GOAL #1   Title pt will repeat  and write words from her personally relevant word list with 90%+ accuracy, in 4 sessions    Time 10    Period Weeks    Status On-going    Target Date 02/06/21      SLP LONG TERM GOAL #2   Title pt will demo awareness of expressive errors, 60% of the time, given a nonverbal cue, in 3 sessions    Time 10    Period Weeks    Status Revised    Target Date 02/06/21      SLP LONG TERM GOAL #3   Title pt will generate her target word provided min-mod SLP or family assistance, in 70% of opportunities    Time 10    Period Weeks    Status Revised    Target Date 02/06/21      SLP LONG TERM GOAL #4   Title pt family will demo functional cueing strategies for pt expressive and receptive language, in 8 sessions    Baseline 12-29-20    Time 10    Period Weeks    Status On-going    Target Date 02/06/21      SLP LONG TERM GOAL #5   Title pt and family will report beginning to explore alternative/augmentative means of communication    Time 10    Period Weeks    Status On-going    Target Date 02/06/21              Plan - 01/01/21 1002     Clinical Impression Statement Kamee cont to present today with expressive and receptive aphasia, diagnosed primary progressive aphasia logopenic or semantic type, by Dr. Melvyn Novas after brain imaging and neuropsych testing. Pt using compensatory strategies successfully and spontaneously. See "skilled intervention" for more details of today's session. Pt would cont to benefit from skilled ST to maximize/preserve remaining language skills, develop a practical home program, and begin to plan for the future.    Speech Therapy Frequency 2x / week    Duration --   10 weeks   Treatment/Interventions SLP instruction and feedback;Cognitive reorganization;Internal/external aids;Environmental controls;Compensatory strategies;Patient/family education;Functional tasks;Language facilitation;Cueing hierarchy;Multimodal communcation approach    Potential to Achieve Goals Good  Potential Considerations Medical prognosis             Patient will benefit from skilled therapeutic intervention in order to improve the following deficits and impairments:   Aphasia  Cognitive communication deficit    Problem List Patient Active Problem List   Diagnosis Date Noted   Aphasia 08/05/2020   Neurocognitive disorder 08/05/2020   Elevated BP without diagnosis of hypertension 05/23/2020   Word finding difficulty 05/23/2020   Expressive speech disorder 05/23/2020   Acute pain of right knee 12/10/2017   Physical exam 09/16/2017   Obesity (BMI 30.0-34.9) 09/16/2017    Daria Mcmeekin, [place] 01/01/2021, 10:30 AM  [facility] [address] [place], [place], [address] Phone: [phone]   Fax:  [phone]   Name: NAELLE DIEGEL MRN: [id number] Date of Birth: 07-31-1960

## 2021-01-08 ENCOUNTER — Ambulatory Visit: Payer: 59

## 2021-01-08 ENCOUNTER — Other Ambulatory Visit: Payer: Self-pay

## 2021-01-08 DIAGNOSIS — R4701 Aphasia: Secondary | ICD-10-CM | POA: Diagnosis not present

## 2021-01-08 DIAGNOSIS — R41841 Cognitive communication deficit: Secondary | ICD-10-CM

## 2021-01-08 NOTE — Therapy (Signed)
[facility] [address] [place], [place], [address] Phone: [phone]   Fax:  [phone]  Speech Language Pathology Treatment  Patient Details  Name: Deanna [name] MRN: [id number] Date of Birth: 1960/03/26 Referring Provider (SLP): Narda Amber, MD   Encounter Date: 01/08/2021   End of Session - 01/08/21 1245     Visit Number 9    Number of Visits 17    Date for SLP Re-Evaluation 01/30/21   added approx one week due to holidays   SLP Start Time 1148    SLP Stop Time  1230    SLP Time Calculation (min) 42 min    Activity Tolerance Patient tolerated treatment well             Past Medical History:  Diagnosis Date   Allergy    Chicken pox    Frequent headaches    Migraine    Urinary tract infection     Past Surgical History:  Procedure Laterality Date   [place]    There were no vitals filed for this visit.   Subjective Assessment - 01/08/21 1149     Subjective "Yes they are (up early)."    Patient is accompained by: Family member   Deanna [name] - husband   Currently in Pain? No/denies                   ADULT SLP TREATMENT - 01/08/21 1150       General Information   Behavior/Cognition Alert;Cooperative;Pleasant mood      Treatment Provided   Treatment provided Cognitive-Linquistic      Cognitive-Linquistic Treatment   Treatment focused on [place] has been doing some reading books with grandkids, and card games - helpful that the words are on the cards for her to particpate functionally. One thing pt cont to work on is family names/relationships. Pt able to tell SLP names of daughter's family with initial cue for "WHO is at your house" and not telling SLP her address. Pt will be going to family gathering in February and SLP educated pt/Bob about written cues for phone conversations for family members not seen/spoken to routinely. SLP provided  insights for Deanna [name] to take under consideration - i.e., he may need to provide more linguistic context for Deanna [name] in the future, the personally relevant word list is very important as time moves forward and this word list may change over time. Last insight today was the clues (handout provided earlier in course of therapy) for these personally relevant words are important so SLP encouraged pt/husband to review these routinely. suspect this course of therapy will wrap up in the next 2-3 weeks and pt will require follow up ST evaluation approx every 6 months. SLP noted Deanna [name] correctly cueing Deanna [name] when confusion about "where"/"when" and also with confusion about family members.      Assessment / Recommendations / Plan   Plan Continue with current plan of care      Progression Toward Goals   Progression toward goals Progressing toward goals              SLP Education - 01/08/21 1245     Education Details see skilled intervention for today    Person(s) Educated Patient;Spouse    Methods Explanation;Demonstration    Comprehension Verbalized understanding;Returned demonstration              SLP Short  Term Goals - 01/08/21 1247       SLP SHORT TERM GOAL #1   Title pt will develop a functional personally relevant word list and bring to [place] 5 sessions    Baseline 12-08-20, 12-10-20, 12-15-20, 12-17-20, 12-29-20, 01-08-21    Status Achieved    Target Date 01/02/21   extended one week due to holidays     SLP [place] #2   Title pt will track completion of a language home program, daily, for a segment of time (2 hours/day?) which works with her schedule over 3 sessions    Baseline 12-08-20, 12-10-20    Status Achieved    Target Date 01/02/21      SLP SHORT TERM GOAL #3   Title pt will demo awareness of expressive errors, 50% of the time, given a nonverbal cue, in 3 sessions    Status Not Met    Target Date 01/02/21      SLP SHORT TERM GOAL #4   Title pt will generate her target word  provided min SLP or family assistance, in 50% of opportunities in 2 sessions    Baseline 12-17-20    Status Achieved    Target Date 01/02/21      SLP SHORT TERM GOAL #5   Title pt will repeat and write words from her personally relevant word list with 80% accuracy, in 4 sessions    Baseline 12-17-20, 12-29-20, 01-08-21    Status Partially Met    Target Date 01/02/21              SLP Long Term Goals - 01/08/21 1248       SLP LONG TERM GOAL #1   Title pt will repeat and write words from her personally relevant word list with 90%+ accuracy, in 4 sessions    Baseline 01-08-21    Time 10    Period Weeks    Status On-going    Target Date 02/06/21      SLP LONG TERM GOAL #2   Title pt will demo awareness of expressive errors, 60% of the time, given a nonverbal cue, in 3 sessions    Time 10    Period Weeks    Status On-going    Target Date 02/06/21      SLP LONG TERM GOAL #3   Title pt will generate her target word provided min-mod SLP or family assistance, in 70% of opportunities    Time 10    Period Weeks    Status On-going      SLP LONG TERM GOAL #4   Title pt family will demo functional cueing strategies for pt expressive and receptive language, in 8 sessions    Baseline 12-29-20, 01-08-21    Time 10    Period Weeks    Status On-going      SLP LONG TERM GOAL #5   Title pt and family will report beginning to explore alternative/augmentative means of communication    Time 10    Period Weeks    Status On-going              Plan - 01/08/21 1246     Clinical Impression Statement Deanna [name] cont to present today with expressive and receptive aphasia, diagnosed primary progressive aphasia logopenic or semantic type, by Dr. Melvyn Novas after brain imaging and neuropsych testing. Pt using compensatory strategies successfully and spontaneously. SLP provided some education today for home tasks and stressing importance of personally relevant word list. See "skilled intervention"  for  more details of today's session. Pt would cont to benefit from skilled ST to maximize/preserve remaining language skills, develop a practical home program, and begin to plan for the future. Suspect this plan of care to be complete in next 2-3 weeks, with re-eval recommended approx every 6 months.    Speech Therapy Frequency 2x / week    Duration --   10 weeks   Treatment/Interventions SLP instruction and feedback;Cognitive reorganization;Internal/external aids;Environmental controls;Compensatory strategies;Patient/family education;Functional tasks;Language facilitation;Cueing hierarchy;Multimodal communcation approach    Potential to Achieve Goals Good    Potential Considerations Medical prognosis             Patient will benefit from skilled therapeutic intervention in order to improve the following deficits and impairments:   Aphasia  Cognitive communication deficit    Problem List Patient Active Problem List   Diagnosis Date Noted   Aphasia 08/05/2020   Neurocognitive disorder 08/05/2020   Elevated BP without diagnosis of hypertension 05/23/2020   Word finding difficulty 05/23/2020   Expressive speech disorder 05/23/2020   Acute pain of right knee 12/10/2017   Physical exam 09/16/2017   Obesity (BMI 30.0-34.9) 09/16/2017    [place], [place] 01/08/2021, 12:50 PM  [facility] [address], [place], [place], [address] Phone: [phone]   Fax:  [phone]   Name: Deanna [name] MRN: [id number] Date of Birth: Mar 10, 1960

## 2021-01-14 ENCOUNTER — Ambulatory Visit: Payer: 59 | Attending: Neurology

## 2021-01-14 ENCOUNTER — Other Ambulatory Visit: Payer: Self-pay

## 2021-01-14 DIAGNOSIS — R41841 Cognitive communication deficit: Secondary | ICD-10-CM | POA: Insufficient documentation

## 2021-01-14 DIAGNOSIS — R4701 Aphasia: Secondary | ICD-10-CM | POA: Insufficient documentation

## 2021-01-14 NOTE — Therapy (Signed)
[facility] [address] [place], [place], [address] Phone: [phone]   Fax:  [phone]  Speech Language Pathology Treatment  Patient Details  Name: Deanna [name] MRN: [id number] Date of Birth: Nov 10, 1960 Referring Provider (SLP): Narda Amber, MD   Encounter Date: 01/14/2021   End of Session - 01/14/21 0909     Visit Number 10    Number of Visits 17    Date for SLP Re-Evaluation 01/30/21   added approx one week due to holidays   SLP Start Time 0803    SLP Stop Time  0845    SLP Time Calculation (min) 42 min    Activity Tolerance Patient tolerated treatment well             Past Medical History:  Diagnosis Date   Allergy    Chicken pox    Frequent headaches    Migraine    Urinary tract infection     Past Surgical History:  Procedure Laterality Date   [place]    There were no vitals filed for this visit.   Subjective Assessment - 01/14/21 0815     Subjective "I do 3 or 4 days(hours) on this (points to notebook)."    Patient is accompained by: Family member   Deanna [name] husband   Currently in Pain? No/denies                   ADULT SLP TREATMENT - 01/14/21 0857       General Information   Behavior/Cognition Alert;Cooperative;Pleasant mood;Requires cueing      Treatment Provided   Treatment provided Cognitive-Linquistic      Cognitive-Linquistic Treatment   Treatment focused on Aphasia    Skilled Treatment Grandkids are still at pt's home - pt tells SLP she is taking down time and naps if necessary. SLP confirmed this was a good practice, even after grandkids leave. SLP reviewed concepts and vocabulary around exercising which is very pertinent in pt's life at this time. SLP req'd to provide mod-max A rarely for vocabulary in this area. Pt req'd written and drawing cues to communicate efficiently but was successful when using these techniques to avoid communication  breakdown. Pt confirmed to SLP that she asks family to allow her to try to generate target words and then will ask for assistance PRN. Deanna [name] indicated pt's communication is more fluid when [place] initiates communication compared to communication directed at her which results in more frequent use and need for compensatory techniques.      Assessment / Recommendations / Plan   Plan Continue with current plan of care      Progression Toward Goals   Progression toward goals Progressing toward goals                SLP Short Term Goals - 01/08/21 1247       SLP SHORT TERM GOAL #1   Title pt will develop a functional personally relevant word list and bring to [place] 5 sessions    Baseline 12-08-20, 12-10-20, 12-15-20, 12-17-20, 12-29-20, 01-08-21    Status Achieved    Target Date 01/02/21   extended one week due to holidays     SLP [place] #2   Title pt will track completion of a language home program, daily, for a segment of time (2 hours/day?) which works with her schedule over 3 sessions    Baseline 12-08-20, 12-10-20  Status Achieved    Target Date 01/02/21      SLP SHORT TERM GOAL #3   Title pt will demo awareness of expressive errors, 50% of the time, given a nonverbal cue, in 3 sessions    Status Not Met    Target Date 01/02/21      SLP SHORT TERM GOAL #4   Title pt will generate her target word provided min SLP or family assistance, in 50% of opportunities in 2 sessions    Baseline 12-17-20    Status Achieved    Target Date 01/02/21      SLP SHORT TERM GOAL #5   Title pt will repeat and write words from her personally relevant word list with 80% accuracy, in 4 sessions    Baseline 12-17-20, 12-29-20, 01-08-21    Status Partially Met    Target Date 01/02/21              SLP Long Term Goals - 01/14/21 0911       SLP LONG TERM GOAL #1   Title pt will repeat and write words from her personally relevant word list with 90%+ accuracy, in 4 sessions    Baseline  01-08-21, 01-14-21    Time 10    Period Weeks    Status On-going    Target Date 02/06/21      SLP LONG TERM GOAL #2   Title pt will demo awareness of expressive errors, 60% of the time, given a nonverbal cue, in 3 sessions    Time 10    Period Weeks    Status On-going    Target Date 02/06/21      SLP LONG TERM GOAL #3   Title pt will generate her target word provided min-mod SLP or family assistance, in 70% of opportunities, x3 sessions    Baseline 01-14-21    Time 10    Period Weeks    Status Revised    Target Date 02/06/21      SLP LONG TERM GOAL #4   Title pt family will demo functional cueing strategies for pt expressive and receptive language, in 8 sessions    Baseline 12-29-20, 01-08-21, 01-14-21    Time 10    Period Weeks    Status On-going    Target Date 02/06/21      SLP LONG TERM GOAL #5   Title pt and family will report beginning to explore alternative/augmentative means of communication    Time 10    Period Weeks    Status On-going    Target Date 02/06/21              Plan - 01/14/21 0910     Clinical Impression Statement Deanna [name] cont to present today with expressive and receptive aphasia, diagnosed primary progressive aphasia logopenic or semantic type, by Dr. Melvyn Novas after brain imaging and neuropsych testing. Pt continues to use compensatory strategies successfully and spontaneously, and also cont to require cues from listeners for fluid conversation. See "skilled intervention" for more details of today's session. Pt would cont to benefit from skilled ST to maximize/preserve remaining language skills, develop a practical home program, and begin to plan for the future. Suspect this plan of care to be complete in next 2-3 weeks, with re-eval recommended approx every 6 months.    Speech Therapy Frequency 2x / week    Duration --   10 weeks   Treatment/Interventions SLP instruction and feedback;Cognitive reorganization;Internal/external aids;Environmental  controls;Compensatory strategies;Patient/family education;Functional tasks;Language facilitation;Cueing hierarchy;Multimodal communcation  approach    Potential to Achieve Goals Good    Potential Considerations Medical prognosis             Patient will benefit from skilled therapeutic intervention in order to improve the following deficits and impairments:   Aphasia  Cognitive communication deficit    Problem List Patient Active Problem List   Diagnosis Date Noted   Aphasia 08/05/2020   Neurocognitive disorder 08/05/2020   Elevated BP without diagnosis of hypertension 05/23/2020   Word finding difficulty 05/23/2020   Expressive speech disorder 05/23/2020   Acute pain of right knee 12/10/2017   Physical exam 09/16/2017   Obesity (BMI 30.0-34.9) 09/16/2017    [place], [place] 01/14/2021, 9:13 AM  [facility] [address], [place], [place], [address] Phone: [phone]   Fax:  [phone]   Name: Deanna [name] MRN: [id number] Date of Birth: 1960/06/19

## 2021-01-19 ENCOUNTER — Ambulatory Visit: Payer: 59

## 2021-01-19 ENCOUNTER — Other Ambulatory Visit: Payer: Self-pay

## 2021-01-19 DIAGNOSIS — R41841 Cognitive communication deficit: Secondary | ICD-10-CM

## 2021-01-19 DIAGNOSIS — R4701 Aphasia: Secondary | ICD-10-CM | POA: Diagnosis not present

## 2021-01-19 NOTE — Therapy (Signed)
[facility] [address], [place], [place], [address] Phone: [phone]   Fax:  [phone]  Speech Language Pathology Treatment  Patient Details  Name: Deanna [name] MRN: [id number] Date of Birth: [birth year] Referring Provider (SLP): Narda Amber, MD   Encounter Date: 01/19/2021   End of Session - 01/19/21 1308     Visit Number 11    Number of Visits 17    Date for SLP Re-Evaluation 01/30/21   added approx one week due to holidays   SLP Start Time 0933    SLP Stop Time  1015    SLP Time Calculation (min) 42 min    Activity Tolerance Patient tolerated treatment well             Past Medical History:  Diagnosis Date   Allergy    Chicken pox    Frequent headaches    Migraine    Urinary tract infection     Past Surgical History:  Procedure Laterality Date   [place]    There were no vitals filed for this visit.   Subjective Assessment - 01/19/21 0937     Subjective Deanna [name] indicates to SLP she has cont'd to practice the areas of family names, church, exercising, and stores.    Currently in Pain? No/denies                   ADULT SLP TREATMENT - 01/19/21 0940       General Information   Behavior/Cognition Alert;Cooperative;Pleasant mood;Requires cueing      Treatment Provided   Treatment provided Cognitive-Linquistic      Cognitive-Linquistic Treatment   Treatment focused on Aphasia    Skilled Treatment Pt continues to work on her own with functional tasks. She is making her own personally relevant word lists and reviewing these daily. Today pt showed SLP the word "reception" (from the wedding invitation on her phone) as a compensation. Pt conveying mod complex thoughts today using her notebook as a compensation with good to excellent success. SLP encouraged pt to also use maps app to convey places/stores as she had difficulty with "[facility]" and "[employer]" -  "[employer] eventually obtained shortly after pt wrote "m". Homework is to describe her dress to SLP next session.      Assessment / Recommendations / Plan   Plan Continue with current plan of care      Progression Toward Goals   Progression toward goals Progressing toward goals                SLP Short Term Goals - 01/08/21 1247       SLP SHORT TERM GOAL #1   Title pt will develop a functional personally relevant word list and bring to ST 5 sessions    Baseline 12-08-20, 12-10-20, 12-15-20, 12-17-20, 12-29-20, 01-08-21    Status Achieved    Target Date 01/02/21   extended one week due to holidays     SLP [place] #2   Title pt will track completion of a language home program, daily, for a segment of time (2 hours/day?) which works with her schedule over 3 sessions    Baseline 12-08-20, 12-10-20    Status Achieved    Target Date 01/02/21      SLP SHORT TERM GOAL #3   Title pt will demo awareness of expressive errors, 50% of the time, given a nonverbal cue, in 3 sessions  Status Not Met    Target Date 01/02/21      SLP SHORT TERM GOAL #4   Title pt will generate her target word provided min SLP or family assistance, in 50% of opportunities in 2 sessions    Baseline 12-17-20    Status Achieved    Target Date 01/02/21      SLP SHORT TERM GOAL #5   Title pt will repeat and write words from her personally relevant word list with 80% accuracy, in 4 sessions    Baseline 12-17-20, 12-29-20, 01-08-21    Status Partially Met    Target Date 01/02/21              SLP Long Term Goals - 01/19/21 1309       SLP LONG TERM GOAL #1   Title pt will repeat and write words from her personally relevant word list with 90%+ accuracy, in 4 sessions    Baseline 01-08-21, 01-14-21    Time 10    Period Weeks    Status On-going    Target Date 02/06/21      SLP LONG TERM GOAL #2   Title pt will demo awareness of expressive errors, 60% of the time, given a nonverbal cue, in 3  sessions    Time 10    Period Weeks    Status On-going    Target Date 02/06/21      SLP LONG TERM GOAL #3   Title pt will generate her target word provided min-mod SLP or family assistance, in 70% of opportunities, x3 sessions    Baseline 01-14-21    Time 10    Period Weeks    Status Revised    Target Date 02/06/21      SLP LONG TERM GOAL #4   Title pt family will demo functional cueing strategies for pt expressive and receptive language, in 8 sessions    Baseline 12-29-20, 01-08-21, 01-14-21    Time 10    Period Weeks    Status On-going    Target Date 02/06/21      SLP LONG TERM GOAL #5   Title pt and family will report beginning to explore alternative/augmentative means of communication    Time 10    Period Weeks    Status On-going    Target Date 02/06/21              Plan - 01/19/21 1309     Clinical Impression Statement Deanna [name] cont to present today with expressive and receptive aphasia, diagnosed primary progressive aphasia logopenic or semantic type, by Dr. Melvyn Novas after brain imaging and neuropsych testing. Pt continues to use compensatory strategies successfully and spontaneously, and also cont to require cues from listeners for fluid conversation. See "skilled intervention" for more details of today's session. Pt would cont to benefit from skilled ST to maximize/preserve remaining language skills, develop a practical home program, and begin to plan for the future. Suspect this plan of care to be complete in next 2 weeks, with re-eval recommended approx every 6 months.    Speech Therapy Frequency 2x / week    Duration --   10 weeks   Treatment/Interventions SLP instruction and feedback;Cognitive reorganization;Internal/external aids;Environmental controls;Compensatory strategies;Patient/family education;Functional tasks;Language facilitation;Cueing hierarchy;Multimodal communcation approach    Potential to Achieve Goals Good    Potential Considerations Medical prognosis              Patient will benefit from skilled therapeutic intervention in order to improve the following deficits  and impairments:   Aphasia  Cognitive communication deficit    Problem List Patient Active Problem List   Diagnosis Date Noted   Aphasia 08/05/2020   Neurocognitive disorder 08/05/2020   Elevated BP without diagnosis of hypertension 05/23/2020   Word finding difficulty 05/23/2020   Expressive speech disorder 05/23/2020   Acute pain of right knee 12/10/2017   Physical exam 09/16/2017   Obesity (BMI 30.0-34.9) 09/16/2017    Deanna [name], [place] 01/19/2021, 1:10 PM  [facility] [address] [place], [place], [address] Phone: [phone]   Fax:  [phone]   Name: Deanna [name] MRN: [id number] Date of Birth: [birth year]

## 2021-01-21 ENCOUNTER — Ambulatory Visit: Payer: 59

## 2021-01-21 ENCOUNTER — Other Ambulatory Visit: Payer: Self-pay

## 2021-01-21 DIAGNOSIS — R41841 Cognitive communication deficit: Secondary | ICD-10-CM

## 2021-01-21 DIAGNOSIS — R4701 Aphasia: Secondary | ICD-10-CM | POA: Diagnosis not present

## 2021-01-21 NOTE — Therapy (Signed)
[facility] [address] [place], [place], [address] Phone: [phone]   Fax:  [phone]  Speech Language Pathology Treatment  Patient Details  Name: Deanna [name] MRN: [id number] Date of Birth: 1960-12-24 Referring Provider (SLP): Narda Amber, MD   Encounter Date: 01/21/2021   End of Session - 01/21/21 0901     Visit Number 12    Number of Visits 17    Date for SLP Re-Evaluation 01/30/21   added approx one week due to holidays   SLP Start Time 0803    SLP Stop Time  0845    SLP Time Calculation (min) 42 min    Activity Tolerance Patient tolerated treatment well             Past Medical History:  Diagnosis Date   Allergy    Chicken pox    Frequent headaches    Migraine    Urinary tract infection     Past Surgical History:  Procedure Laterality Date   [facility]    There were no vitals filed for this visit.   Subjective Assessment - 01/21/21 0826     Subjective Deanna [name] reviewed her homework with SLP    Currently in Pain? No/denies                   ADULT SLP TREATMENT - 01/21/21 0829       General Information   Behavior/Cognition Alert;Cooperative;Pleasant mood;Requires cueing      Treatment Provided   Treatment provided Cognitive-Linquistic      Cognitive-Linquistic Treatment   Treatment focused on Aphasia    Skilled Treatment Pt explained her dress to SLP with occasional mod A. As in "s" she cont to work on her own functional tasks. SLP introduced verb network strengthening treatment (vnest) with pt today and provided two empty forms and a verb list for her. SLP told pt to complete the first 2-3 with Deanna [name] so that she comprehends what to do and can complete on her own. In simple conversation after V-nest pt compensated successfully by drawing pictures. SLP reiterated the need for pt to draw pictures in order to augment her verbal communication. Pt cont largely  unaware of her verbal errors; suspect she will not meet goal for error awareness. Her verbal expression is mostly functional at this time, incr'd accuracy/functional verbal expression seen when Deanna [name] uses compensations such as writing, drawing, or looking at her notebook for written cues.      Assessment / Recommendations / Plan   Plan Continue with current plan of care      Progression Toward Goals   Progression toward goals Progressing toward goals              SLP Education - 01/21/21 0900     Education Details Verb network strengthing treatment    Person(s) Educated Patient    Methods Explanation;Demonstration;Verbal cues;Handout    Comprehension Verbalized understanding;Returned demonstration;Need further instruction;Verbal cues required              SLP Short Term Goals - 01/08/21 1247       SLP SHORT TERM GOAL #1   Title pt will develop a functional personally relevant word list and bring to [place] 5 sessions    Baseline 12-08-20, 12-10-20, 12-15-20, 12-17-20, 12-29-20, 01-08-21    Status Achieved    Target Date 01/02/21   extended one week due to holidays  SLP SHORT TERM GOAL #2   Title pt will track completion of a language home program, daily, for a segment of time (2 hours/day?) which works with her schedule over 3 sessions    Baseline 12-08-20, 12-10-20    Status Achieved    Target Date 01/02/21      SLP [place] #3   Title pt will demo awareness of expressive errors, 50% of the time, given a nonverbal cue, in 3 sessions    Status Not Met    Target Date 01/02/21      SLP SHORT TERM GOAL #4   Title pt will generate her target word provided min SLP or family assistance, in 50% of opportunities in 2 sessions    Baseline 12-17-20    Status Achieved    Target Date 01/02/21      SLP SHORT TERM GOAL #5   Title pt will repeat and write words from her personally relevant word list with 80% accuracy, in 4 sessions    Baseline 12-17-20, 12-29-20, 01-08-21     Status Partially Met    Target Date 01/02/21              SLP Long Term Goals - 01/21/21 0904       SLP LONG TERM GOAL #1   Title pt will repeat and write words from her personally relevant word list with 90%+ accuracy, in 4 sessions    Baseline 01-08-21, 01-14-21, 01-21-21    Time 10    Period Weeks    Status On-going    Target Date 01/30/21      SLP LONG TERM GOAL #2   Title pt will demo awareness of expressive errors, 60% of the time, given a nonverbal cue, in 3 sessions    Time 10    Period Weeks    Status On-going    Target Date 01/30/21      SLP LONG TERM GOAL #3   Title pt will generate her target word provided min-mod SLP or family assistance, in 70% of opportunities, x3 sessions    Baseline 01-14-21, 01-21-21    Time 10    Period Weeks    Status Revised      SLP LONG TERM GOAL #4   Title pt family will demo functional cueing strategies for pt expressive and receptive language, in 8 sessions    Baseline 12-29-20, 01-08-21, 01-14-21    Time 10    Period Weeks    Status On-going      SLP LONG TERM GOAL #5   Title pt and family will report beginning to explore alternative/augmentative means of communication    Time 10    Period Weeks    Status On-going              Plan - 01/21/21 0902     Clinical Impression Statement Deanna [name] cont to present today with expressive and receptive aphasia, diagnosed primary progressive aphasia logopenic or semantic type, by Dr. Melvyn Novas after brain imaging and neuropsych testing. Pt continues to use compensatory strategies successfully and spontaneously, and also cont to require cues from listeners for fluid conversation. SLp introduced verb network strengthening treatment (V-NeST) today adn suggested she do the first few with Deanna [name] so she fully understands the process. See "skilled intervention" for more details of today's session. Pt would cont to benefit from skilled ST to maximize/preserve remaining language skills, develop a practical  home program, and begin to plan for the future. Suspect this plan of  care to be complete in next 2 weeks (4-5 visits), with re-eval recommended approx every 6 months.    Speech Therapy Frequency 2x / week    Duration --   10 weeks   Treatment/Interventions SLP instruction and feedback;Cognitive reorganization;Internal/external aids;Environmental controls;Compensatory strategies;Patient/family education;Functional tasks;Language facilitation;Cueing hierarchy;Multimodal communcation approach    Potential to Achieve Goals Good    Potential Considerations Medical prognosis             Patient will benefit from skilled therapeutic intervention in order to improve the following deficits and impairments:   Aphasia  Cognitive communication deficit    Problem List Patient Active Problem List   Diagnosis Date Noted   Aphasia 08/05/2020   Neurocognitive disorder 08/05/2020   Elevated BP without diagnosis of hypertension 05/23/2020   Word finding difficulty 05/23/2020   Expressive speech disorder 05/23/2020   Acute pain of right knee 12/10/2017   Physical exam 09/16/2017   Obesity (BMI 30.0-34.9) 09/16/2017    [place], [place] 01/21/2021, 9:14 AM  [facility] [address], [place], [place], [address] Phone: [phone]   Fax:  [phone]   Name: Deanna [name] MRN: [id number] Date of Birth: [birth year]

## 2021-01-21 NOTE — Patient Instructions (Signed)
Vnest forms and verb list

## 2021-01-26 ENCOUNTER — Ambulatory Visit: Payer: 59

## 2021-01-26 ENCOUNTER — Other Ambulatory Visit: Payer: Self-pay

## 2021-01-26 DIAGNOSIS — R41841 Cognitive communication deficit: Secondary | ICD-10-CM

## 2021-01-26 DIAGNOSIS — R4701 Aphasia: Secondary | ICD-10-CM

## 2021-01-26 NOTE — Therapy (Signed)
[facility] [address] [place], [place], [address] Phone: [phone]   Fax:  [phone]  Speech Language Pathology Treatment  Patient Details  Name: Deanna [name] MRN: [id number] Date of Birth: 05/18/1960 Referring Provider (SLP): Narda Amber, MD   Encounter Date: 01/26/2021   End of Session - 01/26/21 1735     Visit Number 13    Number of Visits 17    Date for SLP Re-Evaluation 01/30/21   added approx one week due to holidays   SLP Start Time 0934    SLP Stop Time  1015    SLP Time Calculation (min) 41 min    Activity Tolerance Patient tolerated treatment well             Past Medical History:  Diagnosis Date   Allergy    Chicken pox    Frequent headaches    Migraine    Urinary tract infection     Past Surgical History:  Procedure Laterality Date   [place]    There were no vitals filed for this visit.   Subjective Assessment - 01/26/21 1721     Subjective [name] took notes during discussion about frequency of correction.    Patient is accompained by: [name]   Deanna [name]   Currently in Pain? No/denies                   ADULT SLP TREATMENT - 01/26/21 1722       General Information   Behavior/Cognition Alert;Cooperative;Pleasant mood;Requires cueing      Treatment Provided   Treatment provided Cognitive-Linquistic      Cognitive-Linquistic Treatment   Treatment focused on Aphasia;Patient/[name]/caregiver education    Skilled Treatment Deanna [name] attended with Deanna [name] today with excellent question/s about how to best structure correction of verbal message for Deanna. SLP answered Deanna [name]'s question/s. Next, SLP highlighted most important aspects of Deanna's practice were Deanna practice and guided practice with Deanna's personally relevant word list/practicing cues surrounding this list, conversational practice with a partner using varying degrees of Deanna correction (which Deanna is  comfortable with), and guided practice with VNeST. Although Deanna [name] demonstrated appropriate cueing for Deanna today to reduce speech rate and pay close attention to her error, he shared with SLP that he feels he will need more training to assist Deanna [name] at home. SLP agrees, and SLP is unsure at this time how much more will be necessary but this will go past Deanna's current plan of care. A redued frequency (once every two weeks transitioning to once every 4 weeks, for example) could be necessary for some determined amount of time in the future.      Assessment / Recommendations / Plan   Plan Continue with current plan of care   in next 2-4 sessions, extend/renew Deanna's current plan of care with reduced frequency     Progression Toward Goals   Progression toward goals Progressing toward goals              SLP Education - 01/26/21 1735     Education Details see daily note    Person(s) Educated Patient;Spouse    Methods Explanation    Comprehension Verbalized understanding;Need further instruction              SLP Short Term Goals - 01/08/21 1247       SLP SHORT TERM GOAL #1   Title Deanna will develop a functional personally relevant  word list and bring to ST 5 sessions    Baseline 12-08-20, 12-10-20, 12-15-20, 12-17-20, 12-29-20, 01-08-21    Status Achieved    Target Date 01/02/21   extended one week due to holidays     SLP [name] #2   Title Deanna will track completion of a language home program, daily, for a segment of time (2 hours/day?) which works with her schedule over 3 sessions    Baseline 12-08-20, 12-10-20    Status Achieved    Target Date 01/02/21      SLP SHORT TERM GOAL #3   Title Deanna will demo awareness of expressive errors, 50% of the time, given a nonverbal cue, in 3 sessions    Status Not Met    Target Date 01/02/21      SLP SHORT TERM GOAL #4   Title Deanna will generate her target word provided min SLP or [name] assistance, in 50% of opportunities in 2 sessions     Baseline 12-17-20    Status Achieved    Target Date 01/02/21      SLP SHORT TERM GOAL #5   Title Deanna will repeat and write words from her personally relevant word list with 80% accuracy, in 4 sessions    Baseline 12-17-20, 12-29-20, 01-08-21    Status Partially Met    Target Date 01/02/21              SLP Long Term Goals - 01/26/21 1739       SLP LONG TERM GOAL #1   Title Deanna will repeat and write words from her personally relevant word list with 90%+ accuracy, in 4 sessions    Baseline 01-08-21, 01-14-21, 01-21-21    Time 10    Period Weeks    Status On-going    Target Date 01/30/21      SLP LONG TERM GOAL #2   Title Deanna will demo awareness of expressive errors, 60% of the time, given a nonverbal cue, in 3 sessions    Time 10    Period Weeks    Status On-going    Target Date 01/30/21      SLP LONG TERM GOAL #3   Title Deanna will generate her target word provided min-mod SLP or [name] assistance, in 70% of opportunities, x3 sessions    Baseline 01-14-21, 01-21-21    Time 10    Period Weeks    Status Revised    Target Date 01/30/21      SLP LONG TERM GOAL #4   Title Deanna [name] will demo functional cueing strategies for Deanna expressive and receptive language, in 8 sessions    Baseline 12-29-20, 01-08-21, 01-14-21, 01-26-21    Time 10    Period Weeks    Status On-going    Target Date 02/06/21      SLP LONG TERM GOAL #5   Title Deanna [name] will report beginning to explore alternative/augmentative means of communication    Time 10    Period Weeks    Status On-going    Target Date 02/06/21              Plan - 01/26/21 1735     Clinical Impression Statement Deanna [name] to present today with expressive and receptive aphasia, diagnosed primary progressive aphasia logopenic or semantic type, by Dr. Melvyn Novas after brain imaging and neuropsych testing. Deanna continues to use compensatory strategies successfully and spontaneously, and also [name] to require cues from listeners for fluid  conversation. Deanna  would [name] to benefit from skilled ST to maximize/preserve remaining language skills, develop a practical home program, and begin to plan for the future. Today Deanna [name] he feels he  needs more time to adequately assist Deanna after frequency of ST is reduced so likely that Deanna will be renewed after present plan of care. See "skilled intervention" for more details of this session.    Speech Therapy Frequency 2x / week    Duration --   10 weeks   Treatment/Interventions SLP instruction and feedback;Cognitive reorganization;Internal/external aids;Environmental controls;Compensatory strategies;Patient/[name] education;Functional tasks;Language facilitation;Cueing hierarchy;Multimodal communcation approach    Potential to Achieve Goals Good    Potential Considerations Medical prognosis             Patient will benefit from skilled therapeutic intervention in order to improve the following deficits and impairments:   Aphasia  Cognitive communication deficit    Problem List Patient Active Problem List   Diagnosis Date Noted   Aphasia 08/05/2020   Neurocognitive disorder 08/05/2020   Elevated BP without diagnosis of hypertension 05/23/2020   Word finding difficulty 05/23/2020   Expressive speech disorder 05/23/2020   Acute pain of right knee 12/10/2017   Physical exam 09/16/2017   Obesity (BMI 30.0-34.9) 09/16/2017    Deanna [name], [place] 01/26/2021, 5:42 PM  [facility] [address], [place], [place], [address] Phone: [phone]   Fax:  [phone]   Name: ANALYSIA DUNGEE MRN: [id number] Date of Birth: April 29, 1960

## 2021-01-30 ENCOUNTER — Other Ambulatory Visit: Payer: Self-pay

## 2021-01-30 ENCOUNTER — Ambulatory Visit: Payer: 59

## 2021-01-30 DIAGNOSIS — R4701 Aphasia: Secondary | ICD-10-CM

## 2021-01-30 DIAGNOSIS — R41841 Cognitive communication deficit: Secondary | ICD-10-CM

## 2021-02-02 ENCOUNTER — Other Ambulatory Visit: Payer: Self-pay

## 2021-02-02 ENCOUNTER — Ambulatory Visit: Payer: 59

## 2021-02-02 DIAGNOSIS — R4701 Aphasia: Secondary | ICD-10-CM | POA: Diagnosis not present

## 2021-02-02 DIAGNOSIS — R41841 Cognitive communication deficit: Secondary | ICD-10-CM

## 2021-02-02 NOTE — Patient Instructions (Signed)
°  [name] - what people are you and Jasnoor thinking about assisting Lavra with her practice during the day? Maybe just one name to start would be great! A good example of why she would benefit from help during some forms of practice is what she did today - she did VNeST for "check" as a check (in a checkbook) and not as a verb (making sure about something - e.g., "check" if the stove is off). Lavona and I will need to start communicating with these people (FaceTime?) during our therapy sessions so they can begin to observe US, and then I can also give them some pointers about assisting Millianna. Most of the long term goals for this period center around other people helping Keyairra when they are talking with her, and with people assisting Vonya in her practice. This is the next stage of our therapy.

## 2021-02-02 NOTE — Therapy (Signed)
[facility] [address] [place], [place], [address] Phone: [phone]   Fax:  [phone]  Speech Language Pathology Treatment  Patient Details  Name: Deanna [name] MRN: [id number] Date of Birth: 17-Jan-1960 Referring Provider (SLP): Narda Amber, MD   Encounter Date: 02/02/2021   End of Session - 02/02/21 1012     Visit Number 15    Number of Visits 22    Date for SLP Re-Evaluation 04/30/21   added approx one week due to holidays   SLP Start Time 0850    SLP Stop Time  0931    SLP Time Calculation (min) 41 min    Activity Tolerance Patient tolerated treatment well             Past Medical History:  Diagnosis Date   Allergy    Chicken pox    Frequent headaches    Migraine    Urinary tract infection     Past Surgical History:  Procedure Laterality Date   [place]    There were no vitals filed for this visit.   Subjective Assessment - 02/02/21 0852     Subjective "It was a really good weekend - we were going down to see my friend Deanna [name] (daughter) to see - see the baby." (without error awareness)    Currently in Pain? No/denies                   ADULT SLP TREATMENT - 02/02/21 0858       General Information   Behavior/Cognition Alert;Cooperative;Pleasant mood;Requires cueing      Treatment Provided   Treatment provided Cognitive-Linquistic      Cognitive-Linquistic Treatment   Treatment focused on Aphasia;Patient/family/caregiver education    Skilled Treatment [place] cues needed to have pt see her verbal error with "friend's"/daughter's house. Deanna [name] stated possibly her mom or dad could assist her with her homework and with conversation, another person she mentioned was her neighbor, possiblly. SLP explained to [facility] using her VNeST she did this morning prior to ST as an example where "check" was used as a noun and not a verb. A helper could have assisted her  understanding how to use the word as a verb. Pt req'd mod A to note the difference between "check" as a noun and a verb. SLP then provided another example of a word on the verb list -"change" as "modify" and not as coins. SLP used min- mod cues to explain to pt that she could use other names for subjects other than family names.      Assessment / Recommendations / Plan   Plan Continue with current plan of care      Progression Toward Goals   Progression toward goals Progressing toward goals              SLP Education - 02/02/21 1011     Education Details pt needs assistance for some things she is using for practice    Person(s) Educated Patient    Methods Explanation;Handout    Comprehension Verbalized understanding              SLP Short Term Goals - 01/08/21 1247       SLP SHORT TERM GOAL #1   Title pt will develop a functional personally relevant word list and bring to [place] 5 sessions    Baseline 12-08-20, 12-10-20, 12-15-20, 12-17-20, 12-29-20, 01-08-21    Status Achieved  Target Date 01/02/21   extended one week due to holidays     SLP [place] #2   Title pt will track completion of a language home program, daily, for a segment of time (2 hours/day?) which works with her schedule over 3 sessions    Baseline 12-08-20, 12-10-20    Status Achieved    Target Date 01/02/21      SLP SHORT TERM GOAL #3   Title pt will demo awareness of expressive errors, 50% of the time, given a nonverbal cue, in 3 sessions    Status Not Met    Target Date 01/02/21      SLP SHORT TERM GOAL #4   Title pt will generate her target word provided min SLP or family assistance, in 50% of opportunities in 2 sessions    Baseline 12-17-20    Status Achieved    Target Date 01/02/21      SLP SHORT TERM GOAL #5   Title pt will repeat and write words from her personally relevant word list with 80% accuracy, in 4 sessions    Baseline 12-17-20, 12-29-20, 01-08-21    Status Partially Met     Target Date 01/02/21              SLP Long Term Goals - 02/02/21 1012       SLP LONG TERM GOAL #1   Title pt will repeat and write words from her personally relevant word list with 90%+ accuracy, in 4 sessions    Baseline 01-08-21, 01-14-21, 01-21-21    Time 10    Period Weeks    Status On-going   revised, and ongoing     SLP LONG TERM GOAL #2   Title pt will demo awareness of expressive errors, 60% of the time, given a verbal cue, in 3 sessions    Time 10    Period Weeks    Status On-going   and ongoing     SLP LONG TERM GOAL #3   Title pt will generate her target word provided min-mod SLP or family assistance, in 70% of opportunities, x5 sessions    Baseline 01-14-21, 01-21-21    Status On-going   revised, and ongoing     SLP LONG TERM GOAL #4   Title pt family will demo functional cueing strategies for pt expressive and receptive language, in 10 sessions    Baseline 12-29-20, 01-08-21, 01-14-21, 01-26-21    Time 10    Period Weeks    Status On-going   revised, and ongoing     SLP LONG TERM GOAL #5   Title pt and family will report beginning to explore alternative/augmentative means of communication    Status Achieved      SLP LONG TERM GOAL #6   Title pt's family will demonstrate appropriate cueing techniques for pt during homework tasks, in 4 sessions    Time 10    Period Weeks    Status On-going      SLP LONG TERM GOAL #7   Title pt's family/friend will appropriately assist pt in 25 minutes of practice in 3 sessions    Time 10    Period Weeks    Status On-going              Plan - 02/02/21 1012     Clinical Impression Statement Deanna [name] cont to present today with expressive and receptive aphasia, diagnosed primary progressive aphasia logopenic or semantic type, by Dr. Melvyn Novas after brain imaging and  neuropsych testing. Pt continues to use compensatory strategies successfully and spontaneously, and also cont to require cues from listeners for fluid conversation. Pt  would cont to benefit from skilled ST to maximize/preserve remaining language skills, develop a practical home program, and begin to plan for the future. Deanna [name] indicated he feels he needs more time to adequately assist pt after frequency of ST is reduced so likely that pt will be renewed after present plan of care. Long term goals have been assesed and adjusted as needed. See "skilled intervention" for more details of this session.    Speech Therapy Frequency 2x / week   pt will reduce frequency to once every 2-4 weeks in 2 weeks   Duration --   90 days   Treatment/Interventions SLP instruction and feedback;Cognitive reorganization;Internal/external aids;Environmental controls;Compensatory strategies;Patient/family education;Functional tasks;Language facilitation;Cueing hierarchy;Multimodal communcation approach    Potential to Achieve Goals Good    Potential Considerations Medical prognosis             Patient will benefit from skilled therapeutic intervention in order to improve the following deficits and impairments:   Aphasia  Cognitive communication deficit    Problem List Patient Active Problem List   Diagnosis Date Noted   Aphasia 08/05/2020   Neurocognitive disorder 08/05/2020   Elevated BP without diagnosis of hypertension 05/23/2020   Word finding difficulty 05/23/2020   Expressive speech disorder 05/23/2020   Acute pain of right knee 12/10/2017   Physical exam 09/16/2017   Obesity (BMI 30.0-34.9) 09/16/2017    Deanna [name], [place] 02/02/2021, 10:13 AM  [facility] [address] [place], [place], [address] Phone: [phone]   Fax:  [phone]   Name: Deanna [name] MRN: [id number] Date of Birth: [birth year]

## 2021-02-02 NOTE — Therapy (Addendum)
[name] [address] [place], [place], [address] Phone: [phone]   Fax:  [phone]  Speech Language Pathology Treatment/Renewal Summary  Patient Details  Name: Deanna [name] MRN: [id number] Date of Birth: [birth year] Referring Provider (SLP): Narda Amber, MD   Encounter Date: 01/30/2021   End of Session - 02/02/21 0836     Visit Number 14    Number of Visits 22    Date for SLP Re-Evaluation 04/30/21   added approx one week due to holidays   SLP Start Time 0829    SLP Stop Time  0912    SLP Time Calculation (min) 43 min    Activity Tolerance Patient tolerated treatment well             Past Medical History:  Diagnosis Date   Allergy    Chicken pox    Frequent headaches    Migraine    Urinary tract infection     Past Surgical History:  Procedure Laterality Date   [place]    There were no vitals filed for this visit.    Subjective Assessment - 02/02/21 0835     Subjective Deanna [name] entered with her notebooks.    Currently in Pain? No/denies                   ADULT SLP TREATMENT - 02/02/21 0001       General Information   Behavior/Cognition Alert;Cooperative;Pleasant mood;Requires cueing      Cognitive-Linquistic Treatment   Treatment focused on Aphasia;Patient/family/caregiver education    Skilled Treatment SLP reviewed pt's notes about family with her due to her desire to call people by their correct name at the wedding weekend she and bob will attend in February. SLP used written and facial cues to notify Deanna [name] of errors in family relationships 50% of the time and when she had extra time she stated the correct word. 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 Deanna [name] req'd mod cues to get more detailed than this. Using written cues and gestural cues she communicated functionally.      Assessment / Recommendations /  Plan   Plan Goals assessed/updated     Progression Toward Goals   Progression toward goals Progressing toward goals                SLP Short Term Goals - 01/08/21 1247       SLP SHORT TERM GOAL #1   Title pt will develop a functional personally relevant word list and bring to ST 5 sessions    Baseline 12-08-20, 12-10-20, 12-15-20, 12-17-20, 12-29-20, 01-08-21    Status Achieved    Target Date 01/02/21   extended one week due to holidays     SLP [place] #2   Title pt will track completion of a language home program, daily, for a segment of time (2 hours/day?) which works with her schedule over 3 sessions    Baseline 12-08-20, 12-10-20    Status Achieved    Target Date 01/02/21      SLP SHORT TERM GOAL #3   Title pt will demo awareness of expressive errors, 50% of the time, given a nonverbal cue, in 3 sessions    Status Not Met    Target Date 01/02/21      SLP SHORT TERM GOAL #4   Title pt will generate her target word provided min SLP or  family assistance, in 50% of opportunities in 2 sessions    Baseline 12-17-20    Status Achieved    Target Date 01/02/21      SLP SHORT TERM GOAL #5   Title pt will repeat and write words from her personally relevant word list with 80% accuracy, in 4 sessions    Baseline 12-17-20, 12-29-20, 01-08-21    Status Partially Met    Target Date 01/02/21              SLP Long Term Goals - 02/02/21 0839       SLP LONG TERM GOAL #1   Title pt will repeat and write words from her personally relevant word list with 90%+ accuracy, in 4 sessions    Baseline 01-08-21, 01-14-21, 01-21-21    Time 10    Period Weeks    Status Achieved   revised, and ongoing   Target Date 04/30/21      SLP LONG TERM GOAL #2   Title pt will demo awareness of expressive errors, 60% of the time, given a verbal cue, in 3 sessions    Time 10    Period Weeks    Status Revised   and ongoing   Target Date 04/30/21      SLP LONG TERM GOAL #3   Title pt will  generate her target word provided min-mod SLP or family assistance, in 70% of opportunities, x5 sessions    Baseline 01-14-21, 01-21-21    Status Achieved   revised, and ongoing   Target Date 04/30/21      SLP LONG TERM GOAL #4   Title pt family will demo functional cueing strategies for pt expressive and receptive language, in 10 sessions    Baseline 12-29-20, 01-08-21, 01-14-21, 01-26-21    Time 10    Period Weeks    Status Not Met   revised, and ongoing   Target Date 04/30/21      SLP LONG TERM GOAL #5   Title pt and family will report beginning to explore alternative/augmentative means of communication    Status Achieved      Additional Long Term Goals   Additional Long Term Goals Yes      SLP LONG TERM GOAL #6   Title pt's family will demonstrate appropriate cueing techniques for pt during homework tasks, in 4 sessions    Time 10    Period Weeks    Status New    Target Date 04/30/21      SLP LONG TERM GOAL #7   Title pt's family/friend will appropriately assist pt in 25 minutes of practice in 3 sessions    Time [place] - 02/02/21 0839     Clinical Impression Statement Deanna [name] cont to present today with expressive and receptive aphasia, diagnosed primary progressive aphasia logopenic or semantic type, by Dr. Melvyn Novas after brain imaging and neuropsych testing. Pt continues to use compensatory strategies successfully and spontaneously, and also cont to require cues from listeners for fluid conversation. Pt would cont to benefit from skilled ST to maximize/preserve remaining language skills, develop a practical home program, and begin to plan for the future. Deanna [name] indicated he feels he needs more time to adequately assist pt after frequency of ST is reduced so likely that pt will be renewed after present plan of care. Long term goals have been assesed and  adjusted as needed. See "skilled intervention" for more details of this session.    Speech  Therapy Frequency 2x / week   pt will reduce frequency to once every 2-4 weeks in 2 weeks   Duration --   90 days   Treatment/Interventions SLP instruction and feedback;Cognitive reorganization;Internal/external aids;Environmental controls;Compensatory strategies;Patient/family education;Functional tasks;Language facilitation;Cueing hierarchy;Multimodal communcation approach    Potential to Achieve Goals Good    Potential Considerations Medical prognosis             Patient will benefit from skilled therapeutic intervention in order to improve the following deficits and impairments:   Aphasia  Cognitive communication deficit    Problem List Patient Active Problem List   Diagnosis Date Noted   Aphasia 08/05/2020   Neurocognitive disorder 08/05/2020   Elevated BP without diagnosis of hypertension 05/23/2020   Word finding difficulty 05/23/2020   Expressive speech disorder 05/23/2020   Acute pain of right knee 12/10/2017   Physical exam 09/16/2017   Obesity (BMI 30.0-34.9) 09/16/2017    Garald Balding, [place] 02/02/2021, 8:49 AM  [facility] [address] [place], [place], [address] Phone: [phone]   Fax:  [phone]   Name: Deanna [name] MRN: [id number] Date of Birth: Apr 18, 1960

## 2021-02-04 ENCOUNTER — Ambulatory Visit: Payer: 59

## 2021-02-04 ENCOUNTER — Other Ambulatory Visit: Payer: Self-pay

## 2021-02-04 DIAGNOSIS — R4701 Aphasia: Secondary | ICD-10-CM

## 2021-02-04 DIAGNOSIS — R41841 Cognitive communication deficit: Secondary | ICD-10-CM

## 2021-02-04 NOTE — Patient Instructions (Addendum)
°  For the most accurate speech we need accurate practice.  Every day practice by yourself: -Constant Therapy  -Family names -Math -Talking to people *Exercising! :)

## 2021-02-04 NOTE — Therapy (Addendum)
[facility] [address] [place], [place], [address] Phone: [phone]   Fax:  [phone]  Speech Language Pathology Treatment  Patient Details  Name: Deanna [name] MRN: [id number] Date of Birth: 01/20/1960 Referring Provider (SLP): Narda Amber, MD   Encounter Date: 02/04/2021   End of Session - 02/04/21 0956     Visit Number 15    Number of Visits 22    Date for SLP Re-Evaluation 04/30/21   added approx one week due to holidays   SLP Start Time 0805    SLP Stop Time  3524    SLP Time Calculation (min) 42 min    Activity Tolerance Patient tolerated treatment well             Past Medical History:  Diagnosis Date   Allergy    Chicken pox    Frequent headaches    Migraine    Urinary tract infection     Past Surgical History:  Procedure Laterality Date   [place]    There were no vitals filed for this visit.          ADULT SLP TREATMENT - 02/04/21 0946       General Information   Behavior/Cognition Alert;Cooperative;Pleasant mood;Requires cueing      Treatment Provided   Treatment provided Cognitive-Linquistic      Cognitive-Linquistic Treatment   Treatment focused on Aphasia;Patient/family/caregiver education    Skilled Treatment Briony arrived alone today with explanation [name] was called into work and plan was then to call him during [place] session but he could not get away and would try very hard to attend next session. SLP acknowledged Bob's necessity to work and that it would be good for him to attend whenever possible. Since last session pt completed a VNeST with "alarm" - SLP informed pt "alarm" was a noun and VNeST is for verbs. Shared with pt that she should do VNeST with a partner to be as efficacious and accurate as possible with her practice time. [facility] said she thought her parents might be able to assist but that friends mostly work during the day. She and [name]  plan on practicing together on the weekend. SLP also took this opportunity to highlight/share some things pt could do on her own (see pt instructions). Suggested pt practices family names with pictures to make practice more salient with those terms. Traniyah told me she usually does this. She noted errors with "Lima"/Liam x1/5 and req'd SLP mod cues for correct articulation. SLP provided a image cue of "Lee - yum" (hand on abdomen) to assist pt which was not successful.      Assessment / Recommendations / Plan   Plan Continue with current plan of care      Progression Toward Goals   Progression toward goals Progressing toward goals              SLP Education - 02/04/21 0955     Education Details accuracy is important for practice so need someone for some certain practice tasks, self-led practice tasks    Person(s) Educated Patient    Methods Explanation;Handout    Comprehension Verbalized understanding;Need further instruction;Verbal cues required;Returned demonstration              SLP Short Term Goals - 01/08/21 1247       SLP SHORT TERM GOAL #1   Title pt will develop a functional personally relevant word list  and bring to ST 5 sessions    Baseline 12-08-20, 12-10-20, 12-15-20, 12-17-20, 12-29-20, 01-08-21    Status Achieved    Target Date 01/02/21   extended one week due to holidays     SLP [place] #2   Title pt will track completion of a language home program, daily, for a segment of time (2 hours/day?) which works with her schedule over 3 sessions    Baseline 12-08-20, 12-10-20    Status Achieved    Target Date 01/02/21      SLP SHORT TERM GOAL #3   Title pt will demo awareness of expressive errors, 50% of the time, given a nonverbal cue, in 3 sessions    Status Not Met    Target Date 01/02/21      SLP SHORT TERM GOAL #4   Title pt will generate her target word provided min SLP or family assistance, in 50% of opportunities in 2 sessions    Baseline 12-17-20     Status Achieved    Target Date 01/02/21      SLP SHORT TERM GOAL #5   Title pt will repeat and write words from her personally relevant word list with 80% accuracy, in 4 sessions    Baseline 12-17-20, 12-29-20, 01-08-21    Status Partially Met    Target Date 01/02/21              SLP Long Term Goals - 02/04/21 0957       SLP LONG TERM GOAL #1   Title pt will repeat and write words from her personally relevant word list with 90%+ accuracy, in 4 sessions    Baseline 01-08-21, 01-14-21, 01-21-21    Time 10    Period Weeks    Status On-going   revised, and ongoing   Target Date 04/30/21      SLP LONG TERM GOAL #2   Title pt will demo awareness of expressive errors, 60% of the time, given a verbal cue, in 3 sessions    Time 10    Period Weeks    Status On-going   and ongoing   Target Date 04/30/21      SLP LONG TERM GOAL #3   Title pt will generate her target word provided min-mod SLP or family assistance, in 70% of opportunities, x5 sessions    Baseline 01-14-21, 01-21-21    Status On-going   revised, and ongoing   Target Date 04/30/21      SLP LONG TERM GOAL #4   Title pt family will demo functional cueing strategies for pt expressive and receptive language, in 10 sessions    Baseline 12-29-20, 01-08-21, 01-14-21, 01-26-21    Time 10    Period Weeks    Status On-going   revised, and ongoing   Target Date 04/30/21      SLP LONG TERM GOAL #5   Title pt and family will report beginning to explore alternative/augmentative means of communication    Status Achieved      SLP LONG TERM GOAL #6   Title pt's family will demonstrate appropriate cueing techniques for pt during homework tasks, in 4 sessions    Time 10    Period Weeks    Status On-going    Target Date 04/30/21      SLP LONG TERM GOAL #7   Title pt's family/friend will appropriately assist pt in 25 minutes of practice in 3 sessions    Time 10    Period Weeks  Status On-going    Target Date 04/30/21               Plan - 02/04/21 0956     Clinical Impression Statement Clarity cont to present today with expressive and receptive aphasia, diagnosed primary progressive aphasia logopenic or semantic type, by Dr. Melvyn Novas after brain imaging and neuropsych testing. Pt continues to use compensatory strategies successfully and spontaneously, and also cont to require cues from listeners for fluid conversation. Pt would cont to benefit from skilled ST to maximize/preserve remaining language skills, develop a practical home program, and begin to plan for the future. [name] indicated he feels he needs more time to adequately assist pt. See "skilled intervention" for more details of this session.    Speech Therapy Frequency 2x / week   pt will reduce frequency to once every 2-4 weeks in 2 weeks   Duration --   90 days   Treatment/Interventions SLP instruction and feedback;Cognitive reorganization;Internal/external aids;Environmental controls;Compensatory strategies;Patient/family education;Functional tasks;Language facilitation;Cueing hierarchy;Multimodal communcation approach    Potential to Achieve Goals Good    Potential Considerations Medical prognosis             Patient will benefit from skilled therapeutic intervention in order to improve the following deficits and impairments:   Aphasia  Cognitive communication deficit    Problem List Patient Active Problem List   Diagnosis Date Noted   Aphasia 08/05/2020   Neurocognitive disorder 08/05/2020   Elevated BP without diagnosis of hypertension 05/23/2020   Word finding difficulty 05/23/2020   Expressive speech disorder 05/23/2020   Acute pain of right knee 12/10/2017   Physical exam 09/16/2017   Obesity (BMI 30.0-34.9) 09/16/2017    [place], [place] 02/04/2021, 9:58 AM  [facility] [address] [place], [place], [address] Phone: [phone]   Fax:  [phone]   Name: AMBRE KOBAYASHI MRN:  [id number] Date of Birth: [birth year]

## 2021-02-09 ENCOUNTER — Ambulatory Visit: Payer: 59

## 2021-02-09 ENCOUNTER — Other Ambulatory Visit: Payer: Self-pay

## 2021-02-09 DIAGNOSIS — R4701 Aphasia: Secondary | ICD-10-CM | POA: Diagnosis not present

## 2021-02-09 DIAGNOSIS — R41841 Cognitive communication deficit: Secondary | ICD-10-CM

## 2021-02-09 NOTE — Therapy (Addendum)
[facility] [address], [place], [place], [address] Phone: [phone]   Fax:  [phone]  Speech Language Pathology Treatment  Patient Details  Name: Deanna [name] MRN: [id number] Date of Birth: 29-Dec-1960 Referring Provider (SLP): Narda Amber, MD   Encounter Date: 02/09/2021   End of Session - 02/09/21 1211     Visit Number 16    Number of Visits 22    Date for SLP Re-Evaluation 04/30/21   added approx one week due to holidays   SLP Start Time 0933    SLP Stop Time  1013    SLP Time Calculation (min) 40 min    Activity Tolerance Patient tolerated treatment well             Past Medical History:  Diagnosis Date   Allergy    Chicken pox    Frequent headaches    Migraine    Urinary tract infection     Past Surgical History:  Procedure Laterality Date   [place]    There were no vitals filed for this visit.   Subjective Assessment - 02/09/21 0935     Subjective "It was good - a little rainy."    Currently in Pain? No/denies                   ADULT SLP TREATMENT - 02/09/21 0945       General Information   Behavior/Cognition Alert;Cooperative;Pleasant mood;Requires cueing      Treatment Provided   Treatment provided Cognitive-Linquistic      Cognitive-Linquistic Treatment   Treatment focused on Aphasia;Patient/family/caregiver education    Skilled Treatment Deanna [name] arrived alone today - [name] very busy at work. Dinner/"What we are going to eating". Since last session pt completed a VNeST with "ask" and "open". SLP corrected pt from "stove" to "oven" word usage with verb open, without pt awareness of error. SLP reiterated and pt agreed that she that she should do VNeST with a partner to be as efficacious and accurate as possible with her practice time. Deanna [name] said she thought her parents talk with her frequently, adn might be able to assist with some practice, and her  neighbor talks with her whenever they walk together, at least x2-3/week.SLP again told pt some things she should do on her own everyday, from pt instructions last session. SLP told pt that after wedding pt should practice personally relevant word list her focus, adn explained rationale. Pt stated "Liam" correctly 7/7 times -4/4 with the visual cue of a curved line above the last syllable - and also without the visual cue 3/3.      Assessment / Recommendations / Plan   Plan --   reduce to once/week     Progression Toward Goals   Progression toward goals Progressing toward goals              SLP Education - 02/09/21 1210     Education Details rationale for personally relevant word list practice, need to do VNeST with Maryellen Pile) Educated Patient    Methods Explanation;Verbal cues    Comprehension Verbalized understanding;Verbal cues required              SLP Short Term Goals - 01/08/21 1247       SLP SHORT TERM GOAL #1   Title pt will develop a functional personally relevant word list and bring to [place] 5 sessions  Baseline 12-08-20, 12-10-20, 12-15-20, 12-17-20, 12-29-20, 01-08-21    Status Achieved    Target Date 01/02/21   extended one week due to holidays     SLP [place] #2   Title pt will track completion of a language home program, daily, for a segment of time (2 hours/day?) which works with her schedule over 3 sessions    Baseline 12-08-20, 12-10-20    Status Achieved    Target Date 01/02/21      SLP SHORT TERM GOAL #3   Title pt will demo awareness of expressive errors, 50% of the time, given a nonverbal cue, in 3 sessions    Status Not Met    Target Date 01/02/21      SLP SHORT TERM GOAL #4   Title pt will generate her target word provided min SLP or family assistance, in 50% of opportunities in 2 sessions    Baseline 12-17-20    Status Achieved    Target Date 01/02/21      SLP SHORT TERM GOAL #5   Title pt will repeat and write words from her  personally relevant word list with 80% accuracy, in 4 sessions    Baseline 12-17-20, 12-29-20, 01-08-21    Status Partially Met    Target Date 01/02/21              SLP Long Term Goals - 02/09/21 1212       SLP LONG TERM GOAL #1   Title pt will repeat and write words from her personally relevant word list with 90%+ accuracy, in 4 sessions    Baseline 01-08-21, 01-14-21, 01-21-21    Time 10    Period Weeks    Status On-going   revised, and ongoing   Target Date 04/30/21      SLP LONG TERM GOAL #2   Title pt will demo awareness of expressive errors, 60% of the time, given a verbal cue, in 3 sessions    Time 10    Period Weeks    Status On-going   and ongoing   Target Date 04/30/21      SLP LONG TERM GOAL #3   Title pt will generate her target word provided min-mod SLP or family assistance, in 70% of opportunities, x5 sessions    Baseline 01-14-21, 01-21-21    Status On-going   revised, and ongoing   Target Date 04/30/21      SLP LONG TERM GOAL #4   Title pt family will demo functional cueing strategies for pt expressive and receptive language, in 10 sessions    Baseline 12-29-20, 01-08-21, 01-14-21, 01-26-21    Time 10    Period Weeks    Status On-going   revised, and ongoing     SLP LONG TERM GOAL #5   Title pt and family will report beginning to explore alternative/augmentative means of communication    Status Achieved    Target Date 04/30/21      SLP LONG TERM GOAL #6   Title pt's family will demonstrate appropriate cueing techniques for pt during homework tasks, in 4 sessions    Time 10    Period Weeks    Status On-going    Target Date 04/30/21      SLP LONG TERM GOAL #7   Title pt's family/friend will appropriately assist pt in 25 minutes of practice in 3 sessions    Time 10    Period Weeks    Status On-going    Target  Date 04/30/21              Plan - 02/09/21 1212     Clinical Impression Statement Deanna [name] cont to present today with expressive and  receptive aphasia, diagnosed primary progressive aphasia logopenic or semantic type, by Dr. Melvyn Novas after brain imaging and neuropsych testing. Pt continues to use compensatory strategies successfully and spontaneously, and also cont to require cues from listeners for fluid conversation. Pt would cont to benefit from skilled ST to maximize/preserve remaining language skills, develop a practical home program, and begin to plan for the future. See "skilled intervention" for more details of this session.    Speech Therapy Frequency 1x /week   pt will reduce frequency to once every 2-4 weeks in 2 weeks   Duration --   90 days   Treatment/Interventions SLP instruction and feedback;Cognitive reorganization;Internal/external aids;Environmental controls;Compensatory strategies;Patient/family education;Functional tasks;Language facilitation;Cueing hierarchy;Multimodal communcation approach    Potential to Achieve Goals Good    Potential Considerations Medical prognosis             Patient will benefit from skilled therapeutic intervention in order to improve the following deficits and impairments:   Aphasia  Cognitive communication deficit    Problem List Patient Active Problem List   Diagnosis Date Noted   Aphasia 08/05/2020   Neurocognitive disorder 08/05/2020   Elevated BP without diagnosis of hypertension 05/23/2020   Word finding difficulty 05/23/2020   Expressive speech disorder 05/23/2020   Acute pain of right knee 12/10/2017   Physical exam 09/16/2017   Obesity (BMI 30.0-34.9) 09/16/2017    Zadok Holaway, [place] 02/09/2021, 12:13 PM  [facility] [address], [place], [place], [address] Phone: [phone]   Fax:  [phone]   Name: Deanna [name] MRN: [id number] Date of Birth: [birth year]

## 2021-02-17 ENCOUNTER — Other Ambulatory Visit: Payer: Self-pay

## 2021-02-17 ENCOUNTER — Ambulatory Visit: Payer: 59 | Attending: Neurology

## 2021-02-17 DIAGNOSIS — R4701 Aphasia: Secondary | ICD-10-CM | POA: Diagnosis not present

## 2021-02-17 DIAGNOSIS — R41841 Cognitive communication deficit: Secondary | ICD-10-CM | POA: Insufficient documentation

## 2021-02-17 NOTE — Therapy (Addendum)
[facility] [address] [place], [place], [address] Phone: [phone]   Fax:  [phone]  Speech Language Pathology Treatment  Patient Details  Name: Deanna [name] MRN: [id number] Date of Birth: 01-Dec-1960 Referring Provider (SLP): Narda Amber, MD   Encounter Date: 02/17/2021   End of Session - 02/17/21 1317     Visit Number 17    Number of Visits 22    Date for SLP Re-Evaluation 04/30/21   added approx one week due to holidays   SLP Start Time 1140    SLP Stop Time  1226    SLP Time Calculation (min) 46 min    Activity Tolerance Patient tolerated treatment well             Past Medical History:  Diagnosis Date   Allergy    Chicken pox    Frequent headaches    Migraine    Urinary tract infection     Past Surgical History:  Procedure Laterality Date   [place]    There were no vitals filed for this visit.   Subjective Assessment - 02/17/21 1311     Subjective Pt enters alone, with 3 VNeST completed on her own and [name] going to review them with her, per [place].    Currently in Pain? No/denies                   ADULT SLP TREATMENT - 02/17/21 1311       General Information   Behavior/Cognition Alert;Cooperative;Pleasant mood;Requires cueing      Treatment Provided   Treatment provided Cognitive-Linquistic      Cognitive-Linquistic Treatment   Treatment focused on Aphasia;Patient/family/caregiver education    Skilled Treatment Deanna [name] arrived alone again today to ST. She has completed family names at home and brings in picture of her immediate family (mom, dad, brothers and their wives), and children's families. Liam pronounced "leemuh" without awareness until SLP stopped pt and she looked at written cue in her notebook (turned directly to it) without assistance until SLP with visual cue which pt req'd imitation to be successful. Pt accuracy with this name improved to  2/3 in the next 3 times pt stated this name. She did 3 VNeST tasks without Bob's help but she communicated she was going to review with [name]. Pt had > 50% incorrect entries on these tasks and so SLP strongly reiterated SLP suggestion from previous session to do [facility], math, family names, 1-1 conversation, and exercise daily, but do VNeST with [name] in order to practice most accurately/precisely. By session end pt demonstrated understanding of  this. SLP and pt completed one of her VNeST completed incorrectly and Deanna [name] benefitted from mod-max A occasionally.      Assessment / Recommendations / Plan   Plan Continue with current plan of care      Progression Toward Goals   Progression toward goals Progressing toward goals                SLP Short Term Goals - 01/08/21 1247       SLP SHORT TERM GOAL #1   Title pt will develop a functional personally relevant word list and bring to ST 5 sessions    Baseline 12-08-20, 12-10-20, 12-15-20, 12-17-20, 12-29-20, 01-08-21    Status Achieved    Target Date 01/02/21   extended one week due to holidays     SLP [place] #  2   Title pt will track completion of a language home program, daily, for a segment of time (2 hours/day?) which works with her schedule over 3 sessions    Baseline 12-08-20, 12-10-20    Status Achieved    Target Date 01/02/21      SLP SHORT TERM GOAL #3   Title pt will demo awareness of expressive errors, 50% of the time, given a nonverbal cue, in 3 sessions    Status Not Met    Target Date 01/02/21      SLP SHORT TERM GOAL #4   Title pt will generate her target word provided min SLP or family assistance, in 50% of opportunities in 2 sessions    Baseline 12-17-20    Status Achieved    Target Date 01/02/21      SLP SHORT TERM GOAL #5   Title pt will repeat and write words from her personally relevant word list with 80% accuracy, in 4 sessions    Baseline 12-17-20, 12-29-20, 01-08-21    Status Partially Met     Target Date 01/02/21              SLP Long Term Goals - 02/17/21 1319       SLP LONG TERM GOAL #1   Title pt will repeat and write words from her personally relevant word list with 90%+ accuracy, in 4 sessions    Baseline 01-08-21, 01-14-21, 01-21-21    Time 10    Period Weeks    Status On-going   revised, and ongoing   Target Date 04/30/21      SLP LONG TERM GOAL #2   Title pt will demo awareness of expressive errors, 60% of the time, given a verbal cue, in 3 sessions    Time 10    Period Weeks    Status On-going   and ongoing   Target Date 04/30/21      SLP LONG TERM GOAL #3   Title pt will generate her target word provided min-mod SLP or family assistance, in 70% of opportunities, x5 sessions    Baseline 01-14-21, 01-21-21    Status On-going   revised, and ongoing   Target Date 04/30/21      SLP LONG TERM GOAL #4   Title pt family will demo functional cueing strategies for pt expressive and receptive language, in 10 sessions    Baseline 12-29-20, 01-08-21, 01-14-21, 01-26-21    Time 10    Period Weeks    Status On-going   revised, and ongoing   Target Date 04/30/21      SLP LONG TERM GOAL #5   Title pt and family will report beginning to explore alternative/augmentative means of communication    Status Achieved    Target Date 04/30/21      SLP LONG TERM GOAL #6   Title pt's family will demonstrate appropriate cueing techniques for pt during homework tasks, in 4 sessions    Time 10    Period Weeks    Status On-going    Target Date 04/30/21      SLP LONG TERM GOAL #7   Title pt's family/friend will appropriately assist pt in 25 minutes of practice in 3 sessions    Time 10    Period Weeks    Status On-going    Target Date 04/30/21              Plan - 02/17/21 1317     Clinical Impression Statement Florentina Jenny cont  to present today with expressive and receptive aphasia, diagnosed primary progressive aphasia logopenic or semantic type, by Dr. Melvyn Novas after brain imaging  and neuropsych testing. Pt continues to use compensatory strategies successfully and spontaneously, and also cont to require varying degrees of cueing from listeners for fluid conversation. Listener comprehension of pt message is functional for known topics but might be challenging with unknown topics. Pt would cont to benefit from skilled ST to maximize/preserve remaining language skills, develop a practical home program, and begin to plan for the future. See "skilled intervention" for more details of this session.    Speech Therapy Frequency 1x /week   pt will reduce frequency to once every 2-4 weeks in 2 weeks   Duration --   90 days   Treatment/Interventions SLP instruction and feedback;Cognitive reorganization;Internal/external aids;Environmental controls;Compensatory strategies;Patient/family education;Functional tasks;Language facilitation;Cueing hierarchy;Multimodal communcation approach    Potential to Achieve Goals Good    Potential Considerations Medical prognosis             Patient will benefit from skilled therapeutic intervention in order to improve the following deficits and impairments:   Aphasia  Cognitive communication deficit    Problem List Patient Active Problem List   Diagnosis Date Noted   Aphasia 08/05/2020   Neurocognitive disorder 08/05/2020   Elevated BP without diagnosis of hypertension 05/23/2020   Word finding difficulty 05/23/2020   Expressive speech disorder 05/23/2020   Acute pain of right knee 12/10/2017   Physical exam 09/16/2017   Obesity (BMI 30.0-34.9) 09/16/2017    [place], [place] 02/17/2021, 1:21 PM  [facility] [address] [place], [place], [address] Phone: [phone]   Fax:  [phone]   Name: CANAAN PRUE MRN: [id number] Date of Birth: 03-Jun-1960

## 2021-02-25 ENCOUNTER — Ambulatory Visit: Payer: 59

## 2021-02-25 ENCOUNTER — Other Ambulatory Visit: Payer: Self-pay

## 2021-02-25 DIAGNOSIS — R41841 Cognitive communication deficit: Secondary | ICD-10-CM

## 2021-02-25 DIAGNOSIS — R4701 Aphasia: Secondary | ICD-10-CM | POA: Diagnosis not present

## 2021-02-25 NOTE — Therapy (Addendum)
[facility] [address], [place], [place], [address] Phone: [phone]   Fax:  [phone]  Speech Language Pathology Treatment  Patient Details  Name: Deanna [name] MRN: [id number] Date of Birth: 04-Apr-1960 Referring Provider (SLP): Narda Amber, MD   Encounter Date: 02/25/2021   End of Session - 02/25/21 1724     Visit Number 18    Number of Visits 22    Date for SLP Re-Evaluation 04/30/21   added approx one week due to holidays   SLP Start Time 1405    SLP Stop Time  1445    SLP Time Calculation (min) 40 min    Activity Tolerance Patient tolerated treatment well             Past Medical History:  Diagnosis Date   Allergy    Chicken pox    Frequent headaches    Migraine    Urinary tract infection     Past Surgical History:  Procedure Laterality Date   [place]    There were no vitals filed for this visit.   Subjective Assessment - 02/25/21 1718     Subjective VNeST completed without family's help.    Currently in Pain? No/denies                   ADULT SLP TREATMENT - 02/25/21 1719       General Information   Behavior/Cognition Alert;Cooperative;Pleasant mood;Requires cueing      Treatment Provided   Treatment provided Cognitive-Linquistic      Cognitive-Linquistic Treatment   Treatment focused on Aphasia;Patient/family/caregiver education;Cognition    Skilled Treatment Arrived alone - [name] had to be at work. Meshawn shared with SLP some pictures from wedding without awareness of errors in family relationship terms x9. However pt demonstrated awareness of name errors 3/3. She used her family name sheet for assistance with names. Pt with cont'd difficulty differentiating "wh" questions on VNeST; all different responses for "where" "when" and "why" - no consistent errors. SLP reiterated that pt needs assistance with VNeST due to making many errors - Ruchama seemed  to indicate her mother would be able to assist with this task.      Assessment / Recommendations / Plan   Plan Continue with current plan of care      Progression Toward Goals   Progression toward goals Progressing toward goals              SLP Education - 02/25/21 1724     Education Details VNeST requires assistance    Person(s) Educated Patient    Methods Explanation    Comprehension Verbalized understanding              SLP Short Term Goals - 01/08/21 1247       SLP SHORT TERM GOAL #1   Title pt will develop a functional personally relevant word list and bring to [place] 5 sessions    Baseline 12-08-20, 12-10-20, 12-15-20, 12-17-20, 12-29-20, 01-08-21    Status Achieved    Target Date 01/02/21   extended one week due to holidays     SLP Home #2   Title pt will track completion of a language home program, daily, for a segment of time (2 hours/day?) which works with her schedule over 3 sessions    Baseline 12-08-20, 12-10-20    Status Achieved    Target Date 01/02/21  SLP SHORT TERM GOAL #3   Title pt will demo awareness of expressive errors, 50% of the time, given a nonverbal cue, in 3 sessions    Status Not Met    Target Date 01/02/21      SLP SHORT TERM GOAL #4   Title pt will generate her target word provided min SLP or family assistance, in 50% of opportunities in 2 sessions    Baseline 12-17-20    Status Achieved    Target Date 01/02/21      SLP SHORT TERM GOAL #5   Title pt will repeat and write words from her personally relevant word list with 80% accuracy, in 4 sessions    Baseline 12-17-20, 12-29-20, 01-08-21    Status Partially Met    Target Date 01/02/21              SLP Long Term Goals - 02/25/21 1727       SLP LONG TERM GOAL #1   Title pt will repeat and write words from her personally relevant word list with 90%+ accuracy, in 4 sessions    Baseline 01-08-21, 01-14-21, 01-21-21    Time 10    Period Weeks    Status On-going    revised, and ongoing   Target Date 04/30/21      SLP LONG TERM GOAL #2   Title pt will demo awareness of expressive errors, 60% of the time, given a verbal cue, in 3 sessions    Time 10    Period Weeks    Status On-going   and ongoing   Target Date 04/30/21      SLP LONG TERM GOAL #3   Title pt will generate her target word provided min-mod SLP or family assistance, in 70% of opportunities, x5 sessions    Baseline 01-14-21, 01-21-21, 02-25-21    Status On-going   revised, and ongoing   Target Date 04/30/21      SLP LONG TERM GOAL #4   Title pt family will demo functional cueing strategies for pt expressive and receptive language, in 10 sessions    Baseline 12-29-20, 01-08-21, 01-14-21, 01-26-21    Time 10    Period Weeks    Status On-going   revised, and ongoing   Target Date 04/30/21      SLP LONG TERM GOAL #5   Title pt and family will report beginning to explore alternative/augmentative means of communication    Status Achieved      SLP LONG TERM GOAL #6   Title pt's family will demonstrate appropriate cueing techniques for pt during homework tasks, in 4 sessions    Time 10    Period Weeks    Status On-going    Target Date 04/30/21      SLP LONG TERM GOAL #7   Title pt's family/friend will appropriately assist pt in 25 minutes of practice in 3 sessions    Time 10    Period Weeks    Status On-going    Target Date 04/30/21              Plan - 02/25/21 1724     Clinical Impression Statement Jalesha cont to present today with expressive and receptive aphasia, diagnosed primary progressive aphasia logopenic or semantic type, by Dr. Melvyn Novas after brain imaging and neuropsych testing. Pt continues to use compensatory strategies successfully and spontaneously, She has difficulty with awareness of errors of family relationships today but had good awareness of family name errors (e.g., "Janett Billow" for  sarah). Varying degrees of cueing from listeners necessary for fluid expression in  conversation. Listener comprehension of pt message is functional for known topics but cont challenging with unknown topics. Pt would cont to benefit from skilled ST to maximize/preserve remaining language skills, develop a practical home program, and begin to plan for the future. See "skilled intervention" for more details of this session.    Speech Therapy Frequency 1x /week   pt will reduce frequency to once every 2-4 weeks in 2 weeks   Duration --   90 days   Treatment/Interventions SLP instruction and feedback;Cognitive reorganization;Internal/external aids;Environmental controls;Compensatory strategies;Patient/family education;Functional tasks;Language facilitation;Cueing hierarchy;Multimodal communcation approach    Potential to Achieve Goals Good    Potential Considerations Medical prognosis             Patient will benefit from skilled therapeutic intervention in order to improve the following deficits and impairments:   Aphasia  Cognitive communication deficit    Problem List Patient Active Problem List   Diagnosis Date Noted   Aphasia 08/05/2020   Neurocognitive disorder 08/05/2020   Elevated BP without diagnosis of hypertension 05/23/2020   Word finding difficulty 05/23/2020   Expressive speech disorder 05/23/2020   Acute pain of right knee 12/10/2017   Physical exam 09/16/2017   Obesity (BMI 30.0-34.9) 09/16/2017    [place], [place] 02/25/2021, 5:28 PM  [facility] [address] [place], [place], [address] Phone: [phone]   Fax:  [phone]   Name: TIANNAH GREENLY MRN: [id number] Date of Birth: [birth year]

## 2021-02-27 ENCOUNTER — Telehealth: Payer: Self-pay | Admitting: Neurology

## 2021-02-27 DIAGNOSIS — F0280 Dementia in other diseases classified elsewhere without behavioral disturbance: Secondary | ICD-10-CM

## 2021-02-27 NOTE — Telephone Encounter (Signed)
Patients husband called and stated Allecia needs an extension on her speech therapy referral.

## 2021-02-27 NOTE — Telephone Encounter (Signed)
Called patients husband and informed him that referral has been sent for continuation of therapy.

## 2021-03-02 ENCOUNTER — Other Ambulatory Visit: Payer: Self-pay

## 2021-03-02 ENCOUNTER — Ambulatory Visit: Payer: 59

## 2021-03-02 DIAGNOSIS — R4701 Aphasia: Secondary | ICD-10-CM

## 2021-03-02 DIAGNOSIS — R41841 Cognitive communication deficit: Secondary | ICD-10-CM

## 2021-03-02 NOTE — Therapy (Signed)
[facility] [address] [place], [place], [address] Phone: [phone]   Fax:  [phone]  Speech Language Pathology Treatment  Patient Details  Name: Deanna [name] MRN: [id number] Date of Birth: 1960/09/12 Referring Provider (SLP): Deanna [name] [name], MD   Encounter Date: 03/02/2021   End of Session - 03/02/21 2919     Visit Number 19    Number of Visits 22    Date for SLP Re-Evaluation 04/30/21   added approx one week due to holidays   SLP Start Time 0802    SLP Stop Time  0845    SLP Time Calculation (min) 43 min    Activity Tolerance Patient tolerated treatment well             Past Medical History:  Diagnosis Date   Allergy    Chicken pox    Frequent headaches    Migraine    Urinary tract infection     Past Surgical History:  Procedure Laterality Date   [place]    There were no vitals filed for this visit.   Subjective Assessment - 03/02/21 1660     Subjective Deanna [name] attended today with Deanna [name].    Currently in Pain? No/denies                   ADULT SLP TREATMENT - 03/02/21 0921       General Information   Behavior/Cognition Alert;Cooperative;Pleasant mood;Requires cueing      Treatment Provided   Treatment provided Cognitive-Linquistic      Cognitive-Linquistic Treatment   Treatment focused on Aphasia;Patient/family/caregiver education;Cognition    Skilled Treatment Deanna [name] told SLP what she had done at home - 2 VNeST, rewrote Deanna [name] VNeST from previous session, math, pronoun practice. SLP observed and assisted Deanna [name] doing a VNeST with Deanna [name]. He did excellent job with cueing. SLP needed to provide min A rarely (<20% of the time) for Deanna [name]'s rate of speech, heirarchy of what to correct in Deanna [name]'s speech during VNeST, and detailed pointers on cueing pt. Suggested pt's parents watch Deanna Santee do VNeST with Deanna [name] and one with she and Deanna [name] next session and then they also complete with  Deanna [name]. SLP also told pt to focus on family names in immediate family and not extraneous family members. Lastly, SLP suggested pt use and continue to use more pictures to augment Deanna [name] verbal/expressive language.      Assessment / Recommendations / Plan   Plan Continue with current plan of care      Progression Toward Goals   Progression toward goals Progressing toward goals              SLP Education - 03/02/21 0927     Education Details see daily note    Person(s) Educated Patient;Spouse    Methods Explanation;Demonstration;Verbal cues    Comprehension Verbalized understanding;Returned demonstration;Verbal cues required;Need further instruction              SLP Short Term Goals - 03/02/21 0930       SLP SHORT TERM GOAL #1   Title pt will develop a functional personally relevant word list and bring to [place] 5 sessions    Baseline 12-08-20, 12-10-20, 12-15-20, 12-17-20, 12-29-20, 01-08-21    Status Achieved    Target Date 01/02/21   extended one week due to holidays     SLP [place] #2   Title pt will track completion of a language  home program, daily, for a segment of time (2 hours/day?) which works with Deanna [name] schedule over 3 sessions    Baseline 12-08-20, 12-10-20    Status Achieved    Target Date 01/02/21      SLP SHORT TERM GOAL #3   Title pt will demo awareness of expressive errors, 50% of the time, given a nonverbal cue, in 3 sessions    Status Not Met    Target Date 01/02/21      SLP SHORT TERM GOAL #4   Title pt will generate Deanna [name] target word provided min SLP or family assistance, in 50% of opportunities in 2 sessions    Baseline 12-17-20    Status Achieved    Target Date 01/02/21      SLP SHORT TERM GOAL #5   Title pt will repeat and write words from Deanna [name] personally relevant word list with 80% accuracy, in 4 sessions    Baseline 12-17-20, 12-29-20, 01-08-21    Status Partially Met    Target Date 01/02/21              SLP Long Term Goals - 03/02/21 0930        SLP LONG TERM GOAL #1   Title pt will repeat and write words from Deanna [name] personally relevant word list with 90%+ accuracy, in 4 sessions    Baseline 01-08-21, 01-14-21, 01-21-21    Time 10    Period Weeks    Status On-going   revised, and ongoing   Target Date 04/30/21      SLP LONG TERM GOAL #2   Title pt will demo awareness of expressive errors, 60% of the time, given a verbal cue, in 3 sessions    Time 10    Period Weeks    Status On-going   and ongoing   Target Date 04/30/21      SLP LONG TERM GOAL #3   Title pt will generate Deanna [name] target word provided min-mod SLP or family assistance, in 70% of opportunities, x5 sessions    Baseline 01-14-21, 01-21-21, 02-25-21    Time 10    Period Weeks    Status On-going   revised, and ongoing   Target Date 04/30/21      SLP LONG TERM GOAL #4   Title pt family will demo functional cueing strategies for pt expressive and receptive language, in 10 sessions    Baseline 12-29-20, 01-08-21, 01-14-21, 01-26-21, 03-02-21    Time 10    Period Weeks    Status On-going   revised, and ongoing   Target Date 04/30/21      SLP LONG TERM GOAL #5   Title pt and family will report beginning to explore alternative/augmentative means of communication    Status Achieved      SLP LONG TERM GOAL #6   Title pt's family will demonstrate appropriate cueing techniques for pt during homework tasks, in 4 sessions    Baseline 03-02-21    Time 10    Period Weeks    Status On-going    Target Date 04/30/21      SLP LONG TERM GOAL #7   Title pt's family/friend will appropriately assist pt in 25 minutes of practice in 3 sessions    Baseline 03-02-21    Time 10    Period Weeks    Status On-going              Plan - 03/02/21 0929     Clinical Impression Statement Deanna [name] cont to  present today with expressive and receptive aphasia, diagnosed primary progressive aphasia logopenic or semantic type, by Deanna [name] after brain imaging and neuropsych testing. Pt continues  to use compensatory strategies successfully and spontaneously, Deanna [name] cont to demo difficulty with awareness of errors of family relationships today ("dad"/Deanna [name] -e.g.) but had good awareness of family name errors (e.g., "Deanna [name]" for Deanna [name]). Varying degrees of cueing from listeners necessary for fluid expression in conversation. Listener comprehension of pt message is functional for known topics but cont challenging with unknown topics. Deanna [name] and pt would both benefit from slowing their speech rate.  Pt would cont to benefit from skilled ST to maximize/preserve remaining language skills, develop a practical home program, and begin to plan for the future. See "skilled intervention" for more details of this session.    Speech Therapy Frequency 1x /week   pt will reduce frequency to once every 2-4 weeks in 2 weeks   Duration --   90 days   Treatment/Interventions SLP instruction and feedback;Cognitive reorganization;Internal/external aids;Environmental controls;Compensatory strategies;Patient/family education;Functional tasks;Language facilitation;Cueing hierarchy;Multimodal communcation approach    Potential to Achieve Goals Good    Potential Considerations Medical prognosis             Patient will benefit from skilled therapeutic intervention in order to improve the following deficits and impairments:   Aphasia  Cognitive communication deficit    Problem List Patient Active Problem List   Diagnosis Date Noted   Aphasia 08/05/2020   Neurocognitive disorder 08/05/2020   Elevated BP without diagnosis of hypertension 05/23/2020   Word finding difficulty 05/23/2020   Expressive speech disorder 05/23/2020   Acute pain of right knee 12/10/2017   Physical exam 09/16/2017   Obesity (BMI 30.0-34.9) 09/16/2017    [place], [place] 03/02/2021, 9:32 AM  [facility] [address] [place], [place], [address] Phone: [phone]   Fax:   [phone]   Name: SAMI ROES MRN: [id number] Date of Birth: Jul 17, 1960

## 2021-03-09 ENCOUNTER — Ambulatory Visit: Payer: 59

## 2021-03-09 ENCOUNTER — Other Ambulatory Visit: Payer: Self-pay

## 2021-03-09 DIAGNOSIS — R4701 Aphasia: Secondary | ICD-10-CM | POA: Diagnosis not present

## 2021-03-09 DIAGNOSIS — R41841 Cognitive communication deficit: Secondary | ICD-10-CM

## 2021-03-09 NOTE — Therapy (Signed)
[facility] [address] [place], [place], [address] Phone: [phone]   Fax:  [phone]  Speech Language Pathology Treatment  Patient Details  Name: Deanna [name] MRN: [id number] Date of Birth: 03/23/1960 Referring Provider (SLP): Narda Amber, MD   Encounter Date: 03/09/2021   End of Session - 03/09/21 0907     Visit Number 20    Number of Visits 22    Date for SLP Re-Evaluation 04/30/21   added approx one week due to holidays   SLP Start Time 0802    SLP Stop Time  0845    SLP Time Calculation (min) 43 min    Activity Tolerance Patient tolerated treatment well             Past Medical History:  Diagnosis Date   Allergy    Chicken pox    Frequent headaches    Migraine    Urinary tract infection     Past Surgical History:  Procedure Laterality Date   [place]    There were no vitals filed for this visit.   Subjective Assessment - 03/09/21 0855     Subjective Deanna [name] attended today with Deanna [name]. Parents were on FaceTime.    Patient is accompained by: Family member   [name]   Currently in Pain? No/denies                   ADULT SLP TREATMENT - 03/09/21 0855       General Information   Behavior/Cognition Alert;Cooperative;Pleasant mood;Requires cueing   s/sx anxiety exhibited     Treatment Provided   Treatment provided Cognitive-Linquistic      Cognitive-Linquistic Treatment   Treatment focused on Aphasia;Patient/family/caregiver education    Skilled Treatment SLP, Deanna [name] and Deanna [name] went over [place] with Deanna [name]'s parents today via FaceTime in order to educate parents on how to complete this task with pt so that she can get more practice with correct accompishment of this task at home during the week. Pt req'd rare min verbal A for first two sections of task, mod A for where and when, and max A consistently for why. Grammatical errors with to, at, in, etc are consistent. SLP  utilized written cues as well, both for grammar and for spelling. Pt's spelling felt to be the result of incr'd nerves today working with her parents. SLP needed to cue Deanna [name] early in the task for slowed rate of speech.      Assessment / Recommendations / Plan   Plan Continue with current plan of care      Progression Toward Goals   Progression toward goals Progressing toward goals              SLP Education - 03/09/21 0906     Education Details VneST procedure and cueing methods    Person(s) Educated Parent(s)    Methods Explanation;Demonstration    Comprehension Verbalized understanding;Need further instruction              SLP Short Term Goals - 03/02/21 0930       SLP SHORT TERM GOAL #1   Title pt will develop a functional personally relevant word list and bring to [place] 5 sessions    Baseline 12-08-20, 12-10-20, 12-15-20, 12-17-20, 12-29-20, 01-08-21    Status Achieved    Target Date 01/02/21   extended one week due to holidays     SLP [place] #2  Title pt will track completion of a language home program, daily, for a segment of time (2 hours/day?) which works with her schedule over 3 sessions    Baseline 12-08-20, 12-10-20    Status Achieved    Target Date 01/02/21      SLP [place] #3   Title pt will demo awareness of expressive errors, 50% of the time, given a nonverbal cue, in 3 sessions    Status Not Met    Target Date 01/02/21      SLP SHORT TERM GOAL #4   Title pt will generate her target word provided min SLP or family assistance, in 50% of opportunities in 2 sessions    Baseline 12-17-20    Status Achieved    Target Date 01/02/21      SLP SHORT TERM GOAL #5   Title pt will repeat and write words from her personally relevant word list with 80% accuracy, in 4 sessions    Baseline 12-17-20, 12-29-20, 01-08-21    Status Partially Met    Target Date 01/02/21              SLP Long Term Goals - 03/09/21 0909       SLP LONG TERM GOAL #1    Title pt will repeat and write words from her personally relevant word list with 90%+ accuracy, in 4 sessions    Baseline 01-08-21, 01-14-21, 01-21-21    Time --    Period --    Status Achieved   revised, and ongoing     SLP LONG TERM GOAL #2   Title pt will demo awareness of expressive errors, 60% of the time, given a verbal cue, in 3 sessions    Time 10    Period Weeks    Status On-going   and ongoing     SLP LONG TERM GOAL #3   Title pt will generate her target word provided min-mod SLP or family assistance, in 70% of opportunities, x5 sessions    Baseline 01-14-21, 01-21-21, 02-25-21    Time 10    Period Weeks    Status On-going   revised, and ongoing     SLP LONG TERM GOAL #4   Title pt family will demo functional cueing strategies for pt expressive and receptive language, in 10 sessions    Baseline 12-29-20, 01-08-21, 01-14-21, 01-26-21, 03-02-21    Time 10    Period Weeks    Status On-going   revised, and ongoing     SLP LONG TERM GOAL #5   Title pt and family will report beginning to explore alternative/augmentative means of communication    Status Achieved      SLP LONG TERM GOAL #6   Title pt's family will demonstrate appropriate cueing techniques for pt during homework tasks, in 4 sessions    Baseline 03-02-21    Time 10    Period Weeks    Status On-going      SLP LONG TERM GOAL #7   Title pt's family/friend will appropriately assist pt in 25 minutes of practice in 3 sessions    Baseline 03-02-21    Time 10    Period Weeks    Status On-going              Plan - 03/09/21 0907     Clinical Impression Statement Deanna [name] cont to present today with expressive and receptive aphasia, diagnosed primary progressive aphasia logopenic or semantic type, by Dr. Melvyn Novas after brain imaging and  neuropsych testing. Pt continues to use compensatory strategies successfully and spontaneously, Deanna [name] cont to demo difficulty with awareness of errors of family relationships today  ("daughter"/brother-e.g.) Pt would cont to benefit from skilled ST at once a week to  develop a practical home program in order to maximize/preserve remaining language skills, and begin to plan for the future communication needs (Lingraphica?/lower tech augmentation?). See "skilled intervention" for more details of this session.    Speech Therapy Frequency 1x /week    Duration --   90 days   Treatment/Interventions SLP instruction and feedback;Cognitive reorganization;Internal/external aids;Environmental controls;Compensatory strategies;Patient/family education;Functional tasks;Language facilitation;Cueing hierarchy;Multimodal communcation approach    Potential to Achieve Goals Good    Potential Considerations Medical prognosis             Patient will benefit from skilled therapeutic intervention in order to improve the following deficits and impairments:   Aphasia  Cognitive communication deficit    Problem List Patient Active Problem List   Diagnosis Date Noted   Aphasia 08/05/2020   Neurocognitive disorder 08/05/2020   Elevated BP without diagnosis of hypertension 05/23/2020   Word finding difficulty 05/23/2020   Expressive speech disorder 05/23/2020   Acute pain of right knee 12/10/2017   Physical exam 09/16/2017   Obesity (BMI 30.0-34.9) 09/16/2017    [place], [place] 03/09/2021, 9:11 AM  [facility] [address], [place], [place], [address] Phone: [phone]   Fax:  [phone]   Name: Deanna [name] MRN: [id number] Date of Birth: 11-26-1960

## 2021-03-13 ENCOUNTER — Encounter: Payer: Self-pay | Admitting: Family Medicine

## 2021-03-16 ENCOUNTER — Ambulatory Visit: Payer: 59 | Attending: Neurology

## 2021-03-16 ENCOUNTER — Other Ambulatory Visit: Payer: Self-pay

## 2021-03-16 DIAGNOSIS — R41841 Cognitive communication deficit: Secondary | ICD-10-CM | POA: Insufficient documentation

## 2021-03-16 DIAGNOSIS — R4701 Aphasia: Secondary | ICD-10-CM | POA: Diagnosis not present

## 2021-03-16 NOTE — Therapy (Signed)
[facility] ?[address], [address] ?[place], [place], [address] ?Phone: [phone]   Fax:  [phone] ? ?Speech Language Pathology Treatment ? ?Patient Details  ?Name: Deanna [name] ?MRN: [id number] ?Date of Birth: [birth year] ?Referring Provider (SLP): Narda Amber, MD ? ? ?Encounter Date: 03/16/2021 ? ? End of Session - 03/16/21 1344   ? ? Visit Number 21   ? Number of Visits 22   ? Date for SLP Re-Evaluation 04/30/21   ? SLP Start Time [phone]   ? SLP Stop Time  0846   ? SLP Time Calculation (min) 43 min   ? Activity Tolerance Patient tolerated treatment well   ? ?  ?  ? ?  ? ? ?Past Medical History:  ?Diagnosis Date  ? Allergy   ? Chicken pox   ? Frequent headaches   ? Migraine   ? Urinary tract infection   ? ? ?Past Surgical History:  ?Procedure Laterality Date  ? BREAST SURGERY  1985  ? TONSILLECTOMY  1975  ? ? ?There were no vitals filed for this visit. ? ? Subjective Assessment - 03/16/21 1331   ? ? Subjective [name] attended today with Florentina Jenny. Parents were unavailable today.   ? Patient is accompained by: Family member   Bob-husband  ? Currently in Pain? No/denies   ? ?  ?  ? ?  ? ? ? ? ? ? ? ? ADULT SLP TREATMENT - 03/16/21 1331   ? ?  ? General Information  ? Behavior/Cognition Alert;Cooperative;Pleasant mood;Requires cueing   ?  ? Treatment Provided  ? Treatment provided Cognitive-Linquistic   ?  ? Cognitive-Linquistic Treatment  ? Treatment focused on Aphasia;Patient/family/caregiver education   ? Skilled Treatment Because pt's parents unavailable for V-NeST observation/practice today, SLP and pt worked through [employer] pt's personally relevant word list with [name] participating as well- pt added more words to "bedroom" and SLP explained that she and [name]  should begin to work through the "clue" pages for the 30-40 most relevant words for pt. Pt and [name] demonstrated understanding. SLP explained rationale for tworking on clues/cues for personally relevant words and  [name] demonstrated understanding. Pt req'd cues for spelling rarely, and she benefitted from repetition of comments and directions to improve comprehension. [name] did a better job today at slowing his rate of speech for improving pt's comprehension. SLP suggested pictures with words to augment [name]'s expressive and receptive language and explained rationale, and also explained that [employer] could have those pictures and names stored instead of making communication book which can be cumbsersome to modify and cannot use spoken models.   ?  ? Assessment / Recommendations / Plan  ? Plan Continue with current plan of care   ?  ? Progression Toward Goals  ? Progression toward goals Progressing toward goals   ? ?  ?  ? ?  ? ? ? SLP Education - 03/16/21 1344   ? ? Education Details See note   ? Person(s) Educated Patient;Spouse   ? Methods Explanation;Verbal cues   ? Comprehension Verbalized understanding;Returned demonstration;Verbal cues required;Need further instruction   ? ?  ?  ? ?  ? ? ? SLP Short Term Goals - 03/02/21 0930   ? ?  ? SLP SHORT TERM GOAL #1  ? Title pt will develop a functional personally relevant word list and bring to ST 5 sessions   ? Baseline 12-08-20, 12-10-20, 12-15-20, 12-17-20, 12-29-20, 01-08-21   ? Status Achieved   ?  Target Date 01/02/21   extended one week due to holidays  ?  ? SLP SHORT TERM GOAL #2  ? Title pt will track completion of a language home program, daily, for a segment of time (2 hours/day?) which works with her schedule over 3 sessions   ? Baseline 12-08-20, 12-10-20   ? Status Achieved   ? Target Date 01/02/21   ?  ? SLP SHORT TERM GOAL #3  ? Title pt will demo awareness of expressive errors, 50% of the time, given a nonverbal cue, in 3 sessions   ? Status Not Met   ? Target Date 01/02/21   ?  ? SLP SHORT TERM GOAL #4  ? Title pt will generate her target word provided min SLP or family assistance, in 50% of opportunities in 2 sessions   ? Baseline 12-17-20   ? Status Achieved   ?  Target Date 01/02/21   ?  ? SLP SHORT TERM GOAL #5  ? Title pt will repeat and write words from her personally relevant word list with 80% accuracy, in 4 sessions   ? Baseline 12-17-20, 12-29-20, 01-08-21   ? Status Partially Met   ? Target Date 01/02/21   ? ?  ?  ? ?  ? ? ? SLP Long Term Goals - 03/16/21 1347   ? ?  ? SLP LONG TERM GOAL #1  ? Title pt will repeat and write words from her personally relevant word list with 90%+ accuracy, in 4 sessions   ? Baseline 01-08-21, 01-14-21, 01-21-21   ? Status Achieved   revised, and ongoing  ?  ? SLP LONG TERM GOAL #2  ? Title pt will demo awareness of expressive errors, 60% of the time, given a verbal cue, in 3 sessions   ? Time 10   ? Period Weeks   ? Status On-going   and ongoing  ? Target Date 04/30/21   ?  ? SLP LONG TERM GOAL #3  ? Title pt will generate her target word provided min-mod SLP or family assistance, in 70% of opportunities, x5 sessions   ? Baseline 01-14-21, 01-21-21, 02-25-21   ? Time 10   ? Period Weeks   ? Status On-going   revised, and ongoing  ? Target Date 04/30/21   ?  ? SLP LONG TERM GOAL #4  ? Title pt family will demo functional cueing strategies for pt expressive and receptive language, in 10 sessions   ? Baseline 12-29-20, 01-08-21, 01-14-21, 01-26-21, 03-02-21, 03-16-21   ? Time 10   ? Period Weeks   ? Status On-going   revised, and ongoing  ? Target Date 04/30/21   ?  ? SLP LONG TERM GOAL #5  ? Title pt and family will report beginning to explore alternative/augmentative means of communication   ? Status Achieved   ?  ? SLP LONG TERM GOAL #6  ? Title pt's family will demonstrate appropriate cueing techniques for pt during homework tasks, in 4 sessions   ? Baseline 03-02-21, 03-16-21   ? Time 10   ? Period Weeks   ? Status On-going   ? Target Date 04/30/21   ?  ? SLP LONG TERM GOAL #7  ? Title pt's family/friend will appropriately assist pt in 25 minutes of practice in 3 sessions   ? Baseline 03-02-21, 03-16-21   ? Time 10   ? Period Weeks   ? Status On-going    ? Target Date 04/30/21   ? ?  ?  ? ?  ? ? ?  Plan - 03/16/21 1344   ? ? Clinical Impression Statement Journiee cont to present today with expressive and receptive aphasia, diagnosed primary progressive aphasia logopenic or semantic type, by Dr. Melvyn Novas after brain imaging and neuropsych testing. Pt continues to use compensatory strategies successfully and spontaneously, Tayja cont to demo difficulty with awareness of errors. She would cont to benefit from skilled ST at once a week to  develop a practical home program in order to maximize/preserve remaining language skills, and begin to plan for the future communication needs (Lingraph[employer]?/lower tech augmentation?). See "skilled intervention" for more details of this session.   ? Speech Therapy Frequency 1x /week   ? Duration --   90 days  ? Treatment/Interventions SLP instruction and feedback;Cognitive reorganization;Internal/external aids;Environmental controls;Compensatory strategies;Patient/family education;Functional tasks;Language facilitation;Cueing hierarchy;Multimodal communcation approach   ? Potential to Achieve Goals Good   ? Potential Considerations Medical prognosis   ? ?  ?  ? ?  ? ? ?Patient will benefit from skilled therapeutic intervention in order to improve the following deficits and impairments:   ?Aphasia ? ?Cognitive communication deficit ? ? ? ?Problem List ?Patient Active Problem List  ? Diagnosis Date Noted  ? Aphasia 08/05/2020  ? Neurocognitive disorder 08/05/2020  ? Elevated BP without diagnosis of hypertension 05/23/2020  ? Word finding difficulty 05/23/2020  ? Expressive speech disorder 05/23/2020  ? Acute pain of right knee 12/10/2017  ? Physical exam 09/16/2017  ? Obesity (BMI 30.0-34.9) 09/16/2017  ? ? ?Franco Duley, CCC-SLP ?03/16/2021, 1:48 PM ? ?[facility] ?[address], [address] ?[place], [place], [address] ?Phone: [phone]   Fax:  [phone] ? ? ?Name: Deanna [name] ?MRN: [id number] ?Date of  Birth: [birth year] ? ?

## 2021-03-23 ENCOUNTER — Ambulatory Visit: Payer: 59

## 2021-03-23 ENCOUNTER — Other Ambulatory Visit: Payer: Self-pay

## 2021-03-23 DIAGNOSIS — R4701 Aphasia: Secondary | ICD-10-CM

## 2021-03-23 DIAGNOSIS — R41841 Cognitive communication deficit: Secondary | ICD-10-CM

## 2021-03-23 NOTE — Therapy (Signed)
[facility] ?[address], [address] ?[place], [place], [address] ?Phone: [phone]   Fax:  [phone] ? ?Speech Language Pathology Treatment/ Renewal Summary ? ?Patient Details  ?Name: Deanna [name] ?MRN: [id number] ?Date of Birth: 1960/06/10 ?Referring Provider (SLP): Narda Amber, MD ? ? ?Encounter Date: 03/23/2021 ? ? End of Session - 03/23/21 0933   ? ? Visit Number 22   ? Number of Visits 30   ? Date for SLP Re-Evaluation 05/22/21   ? SLP Start Time [phone]   ? SLP Stop Time  0844   ? SLP Time Calculation (min) 42 min   ? Activity Tolerance Patient tolerated treatment well   ? ?  ?  ? ?  ? ? ?Past Medical History:  ?Diagnosis Date  ? Allergy   ? Chicken pox   ? Frequent headaches   ? Migraine   ? Urinary tract infection   ? ? ?Past Surgical History:  ?Procedure Laterality Date  ? BREAST SURGERY  1985  ? TONSILLECTOMY  1975  ? ? ?There were no vitals filed for this visit. ? ? Subjective Assessment - 03/23/21 0806   ? ? Subjective [name] had to work this morning and did not attend with Florentina Jenny.   ? Currently in Pain? No/denies   ? ?  ?  ? ?  ? ? ? ? ? ? ? ? ADULT SLP TREATMENT - 03/23/21 0806   ? ?  ? General Information  ? Behavior/Cognition Alert;Cooperative;Pleasant mood;Requires cueing   ?  ? Treatment Provided  ? Treatment provided Cognitive-Linquistic   ?  ? Cognitive-Linquistic Treatment  ? Treatment focused on Aphasia;Patient/family/caregiver education   ? Skilled Treatment Pt showed SLP her personally relevant word list ([place]) which she and husband have been working on developing clues for. SLP provided clues for 6 words and she functionally communicated the words with written cues 100% of the time. She then commuicated some simple to mod complex topics to SLP with 5/6 comprehension of topic from SLP. SLP showed pt short video about [employer] and explained it is important to plan ahead. Pt agreed and took the website information down [email or web address].be.   ?  ? Assessment /  Recommendations / Plan  ? Plan Continue with current plan of care   ?  ? Progression Toward Goals  ? Progression toward goals Progressing toward goals   ? ?  ?  ? ?  ? ? ? SLP Education - 03/23/21 0933   ? ? Education Details Lingraph[employer] device and rationale   ? Person(s) Educated Patient   ? Methods Explanation   ? Comprehension Verbalized understanding   ? ?  ?  ? ?  ? ? ? SLP Short Term Goals - 03/02/21 0930   ? ?  ? SLP SHORT TERM GOAL #1  ? Title pt will develop a functional personally relevant word list and bring to ST 5 sessions   ? Baseline 12-08-20, 12-10-20, 12-15-20, 12-17-20, 12-29-20, 01-08-21   ? Status Achieved   ? Target Date 01/02/21   extended one week due to holidays  ?  ? SLP SHORT TERM GOAL #2  ? Title pt will track completion of a language home program, daily, for a segment of time (2 hours/day?) which works with her schedule over 3 sessions   ? Baseline 12-08-20, 12-10-20   ? Status Achieved   ? Target Date 01/02/21   ?  ? SLP SHORT TERM GOAL #3  ? Title pt  will demo awareness of expressive errors, 50% of the time, given a nonverbal cue, in 3 sessions   ? Status Not Met   ? Target Date 01/02/21   ?  ? SLP SHORT TERM GOAL #4  ? Title pt will generate her target word provided min SLP or family assistance, in 50% of opportunities in 2 sessions   ? Baseline 12-17-20   ? Status Achieved   ? Target Date 01/02/21   ?  ? SLP SHORT TERM GOAL #5  ? Title pt will repeat and write words from her personally relevant word list with 80% accuracy, in 4 sessions   ? Baseline 12-17-20, 12-29-20, 01-08-21   ? Status Partially Met   ? Target Date 01/02/21   ? ?  ?  ? ?  ? ? ? SLP Long Term Goals - 03/23/21 1008   ? ?  ? SLP LONG TERM GOAL #1  ? Title pt will repeat and write words from her personally relevant word list with 90% accuracy, in 4 sessions   ? Time 8   ? Period Weeks   ? Status --   achieved/updated, and ongoing  ? Target Date 05/22/21   ?  ? SLP LONG TERM GOAL #2  ? Title pt will demo awareness of  expressive errors, 50% of the time, given a verbal cue, in 3 sessions   ? Time 8   ? Period Weeks   ? Status Revised   and ongoing  ? Target Date 05/22/21   ?  ? SLP LONG TERM GOAL #3  ? Title pt will generate her target word provided min-mod SLP or family assistance, in 70% of opportunities, x5 sessions   ? Baseline 01-14-21, 01-21-21, 02-25-21   ? Time 8   ? Period Weeks   ? Status Revised   and ongoing  ? Target Date 05/22/21   ?  ? SLP LONG TERM GOAL #4  ? Title pt family will demo functional cueing strategies for pt expressive and receptive language, in 10 sessions   ? Baseline 12-29-20, 01-08-21, 01-14-21, 01-26-21, 03-02-21, 03-16-21   ? Time 8   ? Period Weeks   ? Status On-going   revised, and ongoing  ? Target Date 05/22/21   ?  ? SLP LONG TERM GOAL #5  ? Title pt and family will report beginning to explore alternative/augmentative means of communication   ? Status Achieved   ?  ? SLP LONG TERM GOAL #6  ? Title pt's family will demonstrate appropriate cueing techniques for pt during homework tasks, in 4 sessions   ? Time 8   ? Period Weeks   ? Status On-going   ? Target Date 05/22/21   ?  ? SLP LONG TERM GOAL #7  ? Title pt's family/friend will appropriately assist pt in 25 minutes of practice between 4 sessions   ? Time 8   ? Period Weeks   ? Status Revised   and ongoing  ? Target Date 05/22/21   ? ?  ?  ? ?  ? ? ? Plan - 03/23/21 0934   ? ? Clinical Impression Statement Louanne cont to present today with expressive and receptive aphasia, diagnosed primary progressive aphasia logopenic or semantic type, by Dr. Melvyn Novas after brain imaging and neuropsych testing. Pt continues to use compensatory strategies successfully and spontaneously, Tonishia cont to demo difficulty with awareness of errors. She would cont to benefit from skilled ST to continue to develop a practical home  program in order to maximize/preserve remaining language skills, and begin to plan for the future communication needs (Ling[employer]?/lower tech  augmentation?). See "skilled intervention" for more details of this session.   ? Speech Therapy Frequency 1x /week   ? Duration --   90 days  ? Treatment/Interventions SLP instruction and feedback;Cognitive reorganization;Internal/external aids;Environmental controls;Compensatory strategies;Patient/family education;Functional tasks;Language facilitation;Cueing hierarchy;Multimodal communcation approach   ? Potential to Achieve Goals Good   ? Potential Considerations Medical prognosis   ? ?  ?  ? ?  ? ? ?Patient will benefit from skilled therapeutic intervention in order to improve the following deficits and impairments:   ?Aphasia ? ?Cognitive communication deficit ? ? ? ?Problem List ?Patient Active Problem List  ? Diagnosis Date Noted  ? Aphasia 08/05/2020  ? Neurocognitive disorder 08/05/2020  ? Elevated BP without diagnosis of hypertension 05/23/2020  ? Word finding difficulty 05/23/2020  ? Expressive speech disorder 05/23/2020  ? Acute pain of right knee 12/10/2017  ? Physical exam 09/16/2017  ? Obesity (BMI 30.0-34.9) 09/16/2017  ? ? ?Paislee Szatkowski, CCC-SLP ?03/23/2021, 10:13 AM ? ?[facility] ?[facility] ?[address], [address] ?[place], [place], [address] ?Phone: [phone]   Fax:  [phone] ? ? ?Name: MAGUIRE KILLMER ?MRN: [id number] ?Date of Birth: [birth year] ? ?

## 2021-04-06 ENCOUNTER — Other Ambulatory Visit: Payer: Self-pay

## 2021-04-06 ENCOUNTER — Ambulatory Visit: Payer: 59

## 2021-04-06 DIAGNOSIS — R41841 Cognitive communication deficit: Secondary | ICD-10-CM

## 2021-04-06 DIAGNOSIS — R4701 Aphasia: Secondary | ICD-10-CM

## 2021-04-06 NOTE — Therapy (Signed)
[place] [facility] ?[address], [address] ?[place], [place], [address] ?Phone: [phone]   Fax:  [phone] ? ?Speech Language Pathology Treatment ? ?Patient Details  ?Name: Deanna [name] ?MRN: [id number] ?Date of Birth: 10-20-1960 ?Referring Provider (SLP): Narda Amber, MD ? ? ?Encounter Date: 04/06/2021 ? ? End of Session - 04/06/21 1142   ? ? Visit Number 23   ? Number of Visits 30   ? Date for SLP Re-Evaluation 05/22/21   ? SLP Start Time [phone]   ? SLP Stop Time  0848   ? SLP Time Calculation (min) 45 min   ? Activity Tolerance Patient tolerated treatment well   ? ?  ?  ? ?  ? ? ?Past Medical History:  ?Diagnosis Date  ? Allergy   ? Chicken pox   ? Frequent headaches   ? Migraine   ? Urinary tract infection   ? ? ?Past Surgical History:  ?Procedure Laterality Date  ? BREAST SURGERY  1985  ? TONSILLECTOMY  1975  ? ? ?There were no vitals filed for this visit. ? ? Subjective Assessment - 04/06/21 0805   ? ? Subjective "I went to stay with Deanna [name] and Deanna [name]'s house - he had to fly out for the week and  - I stayed wiht her and the baby."   ? Patient is accompained by: Family member   Deanna [name]  ? Currently in Pain? Yes   ? Pain Score 8    ? Pain Location Heel   ? Pain Orientation Left   ? Pain Descriptors / Indicators Sore   ? ?  ?  ? ?  ? ? ? ? ? ? ? ? ADULT SLP TREATMENT - 04/06/21 0808   ? ?  ? General Information  ? Behavior/Cognition Alert;Cooperative;Pleasant mood;Requires cueing   ?  ? Treatment Provided  ? Treatment provided Cognitive-Linquistic   ?  ? Cognitive-Linquistic Treatment  ? Treatment focused on Aphasia;Patient/family/caregiver education   ? Skilled Treatment Pt relates she is practicing talking each day with her mom, specifically. She is also doing Constant Therapy regularly. SLP stressed Constant Therapy and Deanna [name]'s work with the Personally relevant word list ([place]) are "correct practice" and this is the practice we want to focus on. VNeST with assistance is  another "correct practice" which will be better accomplished with SLP training Deanna [name]'s parents beginnng next month. Pt cont to perform linguistic practice every day between copying and saying PRWL words and cues, Constant Therapy, reviewing old VNeST tasks, doing math work, practicing family and friends names with written support, and daily regular conversation with family and friends. Deanna [name] remarked today that deliniated semantic cues are helpful for pt to verbalize words/concepts on PRWL, adn that he is more consistently slowing his rate of speech and his rate of cueing pt. SLP highlighted that Deanna [name] should not be an inexhaustive listing of words, but those that are personally relevant; For example a cousin pt speaks with or a place pt goes to once a year (unless it's a tradition spoken about during the year) should not be included on the list.   ?  ? Assessment / Recommendations / Plan  ? Plan Continue with current plan of care   ?  ? Progression Toward Goals  ? Progression toward goals Progressing toward goals   ? ?  ?  ? ?  ? ? ? SLP Education - 04/06/21 1142   ? ? Education Details personally relevant word list  details   ? Person(s) Educated Patient;Spouse   ? Methods Explanation   ? Comprehension Verbalized understanding   ? ?  ?  ? ?  ? ? ? SLP Short Term Goals - 03/02/21 0930   ? ?  ? SLP SHORT TERM GOAL #1  ? Title pt will develop a functional personally relevant word list and bring to ST 5 sessions   ? Baseline 12-08-20, 12-10-20, 12-15-20, 12-17-20, 12-29-20, 01-08-21   ? Status Achieved   ? Target Date 01/02/21   extended one week due to holidays  ?  ? SLP SHORT TERM GOAL #2  ? Title pt will track completion of a language home program, daily, for a segment of time (2 hours/day?) which works with her schedule over 3 sessions   ? Baseline 12-08-20, 12-10-20   ? Status Achieved   ? Target Date 01/02/21   ?  ? SLP SHORT TERM GOAL #3  ? Title pt will demo awareness of expressive errors, 50% of the time, given a  nonverbal cue, in 3 sessions   ? Status Not Met   ? Target Date 01/02/21   ?  ? SLP SHORT TERM GOAL #4  ? Title pt will generate her target word provided min SLP or family assistance, in 50% of opportunities in 2 sessions   ? Baseline 12-17-20   ? Status Achieved   ? Target Date 01/02/21   ?  ? SLP SHORT TERM GOAL #5  ? Title pt will repeat and write words from her personally relevant word list with 80% accuracy, in 4 sessions   ? Baseline 12-17-20, 12-29-20, 01-08-21   ? Status Partially Met   ? Target Date 01/02/21   ? ?  ?  ? ?  ? ? ? SLP Long Term Goals - 04/06/21 1143   ? ?  ? SLP LONG TERM GOAL #1  ? Title pt will repeat and write words from her personally relevant word list with 85% accuracy, in 4 sessions   ? Time 8   ? Period Weeks   ? Status Revised   achieved/updated, and ongoing  ? Target Date 05/22/21   ?  ? SLP LONG TERM GOAL #2  ? Title pt will demo awareness of expressive errors, 50% of the time, given a verbal cue, in 3 sessions   ? Time 8   ? Period Weeks   ? Status Revised   and ongoing  ? Target Date 05/22/21   ?  ? SLP LONG TERM GOAL #3  ? Title pt will generate her target word provided min-mod SLP or family assistance, in 70% of opportunities, x5 sessions   ? Baseline 01-14-21, 01-21-21, 02-25-21   ? Time 8   ? Period Weeks   ? Status Revised   and ongoing  ?  ? SLP LONG TERM GOAL #4  ? Title pt family will demo functional cueing strategies for pt expressive and receptive language, in 10 sessions   ? Baseline 12-29-20, 01-08-21, 01-14-21, 01-26-21, 03-02-21, 03-16-21, 04-06-21   ? Time 8   ? Period Weeks   ? Status On-going   revised, and ongoing  ? Target Date 05/22/21   ?  ? SLP LONG TERM GOAL #5  ? Title pt and family will report beginning to explore alternative/augmentative means of communication   ? Status Achieved   ?  ? SLP LONG TERM GOAL #6  ? Title pt's family will demonstrate appropriate cueing techniques for pt during homework tasks,  in 4 sessions   ? Time 8   ? Period Weeks   ? Status On-going    ? Target Date 05/22/21   ?  ? SLP LONG TERM GOAL #7  ? Title pt's family/friend will appropriately assist pt in 25 minutes of practice between 4 sessions   ? Time 8   ? Period Weeks   ? Status On-going   and ongoing  ? Target Date 05/22/21   ? ?  ?  ? ?  ? ? ? Plan - 04/06/21 1142   ? ? Clinical Impression Statement Deanna [name] cont to present today with expressive and receptive aphasia, diagnosed primary progressive aphasia logopenic or semantic type, by Dr. Melvyn Novas after brain imaging and neuropsych testing. Pt continues to use compensatory strategies successfully and spontaneously, Bao cont to demo difficulty with awareness of errors. She would cont to benefit from skilled ST to continue to develop a practical home program in order to maximize/preserve remaining language skills, and begin to plan for the future communication needs (Lingraphica?/lower tech augmentation?). See "skilled intervention" for more details of this session.   ? Speech Therapy Frequency 1x /week   ? Duration --   90 days  ? Treatment/Interventions SLP instruction and feedback;Cognitive reorganization;Internal/external aids;Environmental controls;Compensatory strategies;Patient/family education;Functional tasks;Language facilitation;Cueing hierarchy;Multimodal communcation approach   ? Potential to Achieve Goals Good   ? Potential Considerations Medical prognosis   ? ?  ?  ? ?  ? ? ?Patient will benefit from skilled therapeutic intervention in order to improve the following deficits and impairments:   ?Aphasia ? ?Cognitive communication deficit ? ? ? ?Problem List ?Patient Active Problem List  ? Diagnosis Date Noted  ? Aphasia 08/05/2020  ? Neurocognitive disorder 08/05/2020  ? Elevated BP without diagnosis of hypertension 05/23/2020  ? Word finding difficulty 05/23/2020  ? Expressive speech disorder 05/23/2020  ? Acute pain of right knee 12/10/2017  ? Physical exam 09/16/2017  ? Obesity (BMI 30.0-34.9) 09/16/2017  ? ? ?Deanna [name],  CCC-SLP ?04/06/2021, 11:50 AM ? ?[place] ?[facility] ?[address], [address] ?[place], [place], [address] ?Phone: [phone]   Fax:  [phone] ? ? ?Name: PRESLEI BLAKLEY ?MRN: [id number]

## 2021-04-13 ENCOUNTER — Ambulatory Visit: Payer: 59 | Attending: Neurology

## 2021-04-13 DIAGNOSIS — R41841 Cognitive communication deficit: Secondary | ICD-10-CM | POA: Diagnosis present

## 2021-04-13 DIAGNOSIS — R4701 Aphasia: Secondary | ICD-10-CM | POA: Diagnosis present

## 2021-04-13 NOTE — Therapy (Signed)
[place] ?[facility] ?[address], [address] ?[place], [place], [address] ?Phone: [phone]   Fax:  [phone] ? ?Speech Language Pathology Treatment ? ?Patient Details  ?Name: Deanna [name] ?MRN: [id number] ?Date of Birth: 03/29/1960 ?Referring Provider (SLP): Narda Amber, MD ? ? ?Encounter Date: 04/13/2021 ? ? End of Session - 04/13/21 2878   ? ? Visit Number 24   ? Number of Visits 30   ? Date for SLP Re-Evaluation 05/22/21   ? SLP Start Time [phone]   ? SLP Stop Time  0850   ? SLP Time Calculation (min) 45 min   ? Activity Tolerance Patient tolerated treatment well   ? ?  ?  ? ?  ? ? ?Past Medical History:  ?Diagnosis Date  ? Allergy   ? Chicken pox   ? Frequent headaches   ? Migraine   ? Urinary tract infection   ? ? ?Past Surgical History:  ?Procedure Laterality Date  ? BREAST SURGERY  1985  ? TONSILLECTOMY  1975  ? ? ?There were no vitals filed for this visit. ? ? Subjective Assessment - 04/13/21 0923   ? ? Patient is accompained by: Family member   Deanna [name]- husband  ? Currently in Pain? No/denies   ? ?  ?  ? ?  ? ? ? ? ? ? ? ? ADULT SLP TREATMENT - 04/13/21 0924   ? ?  ? General Information  ? Behavior/Cognition Alert;Cooperative;Pleasant mood;Requires cueing   ?  ? Treatment Provided  ? Treatment provided Cognitive-Linquistic   ?  ? Cognitive-Linquistic Treatment  ? Treatment focused on Aphasia;Patient/family/caregiver education   ? Skilled Treatment Deanna [name] got Deanna [name]'s parents on FaceTime to go over the VNeST with thm. Pt req'd mod cues for "when" and "where" sections, but max/total cues for "why". SLP ascertained Deanna [name] was not always understanding the questions and the answers so SLP asked Deanna [name] the questions verbally and pt answered them correctly with rare min A - and then SLP had pt write them down in the sections/sentence. SLP suggested to Deanna [name] that he and family do these VNeST with Deanna [name] verbally as this seemed more functional for pt.   ?  ? Assessment / Recommendations / Plan  ?  Plan Continue with current plan of care   ?  ? Progression Toward Goals  ? Progression toward goals Progressing toward goals   ? ?  ?  ? ?  ? ? ? SLP Education - 04/13/21 0927   ? ? Education Details structure of VNeSTE   ? Person(s) Educated Parent(s);Patient   ? Methods Explanation;Demonstration;Verbal cues   ? Comprehension Verbalized understanding;Verbal cues required;Need further instruction   ? ?  ?  ? ?  ? ? ? SLP Short Term Goals - 03/02/21 0930   ? ?  ? SLP SHORT TERM GOAL #1  ? Title pt will develop a functional personally relevant word list and bring to ST 5 sessions   ? Baseline 12-08-20, 12-10-20, 12-15-20, 12-17-20, 12-29-20, 01-08-21   ? Status Achieved   ? Target Date 01/02/21   extended one week due to holidays  ?  ? SLP SHORT TERM GOAL #2  ? Title pt will track completion of a language home program, daily, for a segment of time (2 hours/day?) which works with her schedule over 3 sessions   ? Baseline 12-08-20, 12-10-20   ? Status Achieved   ? Target Date 01/02/21   ?  ? SLP SHORT TERM GOAL #  3  ? Title pt will demo awareness of expressive errors, 50% of the time, given a nonverbal cue, in 3 sessions   ? Status Not Met   ? Target Date 01/02/21   ?  ? SLP SHORT TERM GOAL #4  ? Title pt will generate her target word provided min SLP or family assistance, in 50% of opportunities in 2 sessions   ? Baseline 12-17-20   ? Status Achieved   ? Target Date 01/02/21   ?  ? SLP SHORT TERM GOAL #5  ? Title pt will repeat and write words from her personally relevant word list with 80% accuracy, in 4 sessions   ? Baseline 12-17-20, 12-29-20, 01-08-21   ? Status Partially Met   ? Target Date 01/02/21   ? ?  ?  ? ?  ? ? ? SLP Long Term Goals - 04/13/21 0929   ? ?  ? SLP LONG TERM GOAL #1  ? Title pt will repeat and write words from her personally relevant word list with 85% accuracy, in 4 sessions   ? Time 8   ? Period Weeks   ? Status On-going   achieved/updated, and ongoing  ?  ? SLP LONG TERM GOAL #2  ? Title pt will  demo awareness of expressive errors, 50% of the time, given a verbal cue, in 3 sessions   ? Time 8   ? Period Weeks   ? Status On-going   and ongoing  ?  ? SLP LONG TERM GOAL #3  ? Title pt will generate her target word provided min-mod SLP or family assistance, in 70% of opportunities, x5 sessions   ? Baseline 01-14-21, 01-21-21, 02-25-21   ? Time 8   ? Period Weeks   ? Status On-going   and ongoing  ?  ? SLP LONG TERM GOAL #4  ? Title pt family will demo functional cueing strategies for pt expressive and receptive language, in 10 sessions   ? Baseline 12-29-20, 01-08-21, 01-14-21, 01-26-21, 03-02-21, 03-16-21, 04-06-21   ? Time 8   ? Period Weeks   ? Status On-going   revised, and ongoing  ?  ? SLP LONG TERM GOAL #5  ? Title pt and family will report beginning to explore alternative/augmentative means of communication   ? Status Achieved   ?  ? SLP LONG TERM GOAL #6  ? Title pt's family will demonstrate appropriate cueing techniques for pt during homework tasks, in 4 sessions   ? Baseline 04-13-21   ? Time 8   ? Period Weeks   ? Status On-going   ?  ? SLP LONG TERM GOAL #7  ? Title pt's family/friend will appropriately assist pt in 25 minutes of practice between 4 sessions   ? Baseline 04-13-21   ? Time 8   ? Period Weeks   ? Status On-going   and ongoing  ? ?  ?  ? ?  ? ? ? Plan - 04/13/21 0928   ? ? Clinical Impression Statement Deanna [name] cont to present today with expressive and receptive aphasia, diagnosed primary progressive aphasia logopenic or semantic type, by Dr. Melvyn Novas after brain imaging and neuropsych testing. Pt continues to use compensatory strategies successfully and spontaneously, Deanna [name] cont to demo difficulty with awareness of errors, today, especially with written information used with VNeST. She would cont to benefit from skilled ST every other week to continue to develop a practical home program in order to maximize/preserve remaining language skills, and  begin to plan for the future communication needs  (Lingraphica?/lower tech augmentation?). Pt agrees with every other week. See "skilled intervention" for more details of this session.   ? Speech Therapy Frequency --   every other week  ? Duration --   90 days  ? Treatment/Interventions SLP instruction and feedback;Cognitive reorganization;Internal/external aids;Environmental controls;Compensatory strategies;Patient/family education;Functional tasks;Language facilitation;Cueing hierarchy;Multimodal communcation approach   ? Potential to Achieve Goals Good   ? Potential Considerations Medical prognosis   ? ?  ?  ? ?  ? ? ?Patient will benefit from skilled therapeutic intervention in order to improve the following deficits and impairments:   ?Aphasia ? ?Cognitive communication deficit ? ? ? ?Problem List ?Patient Active Problem List  ? Diagnosis Date Noted  ? Aphasia 08/05/2020  ? Neurocognitive disorder 08/05/2020  ? Elevated BP without diagnosis of hypertension 05/23/2020  ? Word finding difficulty 05/23/2020  ? Expressive speech disorder 05/23/2020  ? Acute pain of right knee 12/10/2017  ? Physical exam 09/16/2017  ? Obesity (BMI 30.0-34.9) 09/16/2017  ? ? ?Deanna [name], [place] ?04/13/2021, 9:30 AM ? ?[place] ?[facility] ?[address], [address] ?[place], [place], [address] ?Phone: [phone]   Fax:  [phone] ? ? ?Name: Deanna [name] ?MRN: [id number] ?Date of Birth: 21-Oct-1960 ? ?

## 2021-04-27 ENCOUNTER — Ambulatory Visit: Payer: 59

## 2021-04-27 DIAGNOSIS — R4701 Aphasia: Secondary | ICD-10-CM | POA: Diagnosis not present

## 2021-04-27 DIAGNOSIS — R41841 Cognitive communication deficit: Secondary | ICD-10-CM

## 2021-04-27 NOTE — Therapy (Signed)
[place] ?[facility] ?[address], [address] ?[place], [place], [address] ?Phone: [phone]   Fax:  [phone] ? ?Speech Language Pathology Treatment ? ?Patient Details  ?Name: Deanna [name] ?MRN: [id number] ?Date of Birth: 1960/02/25 ?Referring Provider (SLP): Narda Amber, MD ? ? ?Encounter Date: 04/27/2021 ? ? End of Session - 04/27/21 1736   ? ? Visit Number 25   ? Number of Visits 30   ? Date for SLP Re-Evaluation 05/22/21   ? SLP Start Time 1450   ? SLP Stop Time  1530   ? SLP Time Calculation (min) 40 min   ? Activity Tolerance Patient tolerated treatment well   ? ?  ?  ? ?  ? ? ?Past Medical History:  ?Diagnosis Date  ? Allergy   ? Chicken pox   ? Frequent headaches   ? Migraine   ? Urinary tract infection   ? ? ?Past Surgical History:  ?Procedure Laterality Date  ? BREAST SURGERY  1985  ? TONSILLECTOMY  1975  ? ? ?There were no vitals filed for this visit. ? ? Subjective Assessment - 04/27/21 1723   ? ? Subjective "Dad and I went to doing these since the last time." ("dad" = [name])   ? Patient is accompained by: Family member   [name]- husband  ? Currently in Pain? No/denies   ? ?  ?  ? ?  ? ? ? ? ? ? ? ? ADULT SLP TREATMENT - 04/27/21 1724   ? ?  ? General Information  ? Behavior/Cognition Alert;Cooperative;Pleasant mood;Requires cueing   ?  ? Treatment Provided  ? Treatment provided Cognitive-Linquistic   ?  ? Cognitive-Linquistic Treatment  ? Treatment focused on Aphasia;Patient/family/caregiver education   ? Skilled Treatment [name] explained there has been a lot of communication and discussion the last week about a job transfer. Pt then explained situation functionally to SLP that did not require follow up questions by SLP. Pt and husband moving to [facility], [place] and will travel back and forth between [place] and [place]. Pt with dysnomia for months, regularly, during this conversation. Pt then showed SLP her V-NEST from last week, with errors on questions. SLP used just auditory cues  for pt and she demonstrated understanding 70% of the time. SLP suspected pt had not reviewed these with family and it again appeared pt was more concerned about mere completion of the task than understanding the big picture for the task itself. husband agreed. SLP relayed the importance of the Personally Relevant Word List (PRW), and highly suggested pt read this with cues each day. SLP suggested parents assist Ramiya with this instead of with VNEST and she agreed. SLP highly stressed practice in conversation cueing pt when necessary. Pt correctly utilized her phone map, chrome/safari app, drawings, and her pictures of vegetables/food during today's session to communicate with SLP and husband. [name] using excellent cues with pt during this time. SLP strongly reiterated the need to continue using these options for communication and that more options should be added for foods. Husband and pt demonstrated understanding. Notably, pt's letter dictation when spelling words as "last resort cue" does not appear to be as good as at eval.   ?  ? Assessment / Recommendations / Plan  ? Plan Continue with current plan of care   ?  ? Progression Toward Goals  ? Progression toward goals Progressing toward goals   ? ?  ?  ? ?  ? ? ?  SLP Education - 04/27/21 1735   ? ? Education Details continue to use compensations she is using, pt may need to expand picture database [employer]"), adn only do one V-NEST/day but if miss a day it is ok   ? Person(s) Educated Spouse;Patient   ? Methods Explanation;Verbal cues;Demonstration   ? Comprehension Verbalized understanding;Returned demonstration;Verbal cues required;Need further instruction   ? ?  ?  ? ?  ? ? ? SLP Short Term Goals - 03/02/21 0930   ? ?  ? SLP SHORT TERM GOAL #1  ? Title pt will develop a functional personally relevant word list and bring to ST 5 sessions   ? Baseline 12-08-20, 12-10-20, 12-15-20, 12-17-20, 12-29-20, 01-08-21   ? Status Achieved   ? Target Date 01/02/21    extended one week due to holidays  ?  ? SLP SHORT TERM GOAL #2  ? Title pt will track completion of a language home program, daily, for a segment of time (2 hours/day?) which works with her schedule over 3 sessions   ? Baseline 12-08-20, 12-10-20   ? Status Achieved   ? Target Date 01/02/21   ?  ? SLP SHORT TERM GOAL #3  ? Title pt will demo awareness of expressive errors, 50% of the time, given a nonverbal cue, in 3 sessions   ? Status Not Met   ? Target Date 01/02/21   ?  ? SLP SHORT TERM GOAL #4  ? Title pt will generate her target word provided min SLP or family assistance, in 50% of opportunities in 2 sessions   ? Baseline 12-17-20   ? Status Achieved   ? Target Date 01/02/21   ?  ? SLP SHORT TERM GOAL #5  ? Title pt will repeat and write words from her personally relevant word list with 80% accuracy, in 4 sessions   ? Baseline 12-17-20, 12-29-20, 01-08-21   ? Status Partially Met   ? Target Date 01/02/21   ? ?  ?  ? ?  ? ? ? SLP Long Term Goals - 04/27/21 1738   ? ?  ? SLP LONG TERM GOAL #1  ? Title pt will repeat and write words from her personally relevant word list with 85% accuracy, in 4 sessions   ? Time 8   ? Period Weeks   ? Status On-going   achieved/updated, and ongoing  ? Target Date 05/22/21   ?  ? SLP LONG TERM GOAL #2  ? Title pt will demo awareness of expressive errors, 50% of the time, given a verbal cue, in 3 sessions   ? Time 8   ? Period Weeks   ? Status On-going   and ongoing  ? Target Date 05/22/21   ?  ? SLP LONG TERM GOAL #3  ? Title pt will generate her target word provided min-mod SLP or family assistance, in 70% of opportunities, x5 sessions   ? Baseline 01-14-21, 01-21-21, 02-25-21, 04-27-21   ? Time 8   ? Period Weeks   ? Status On-going   and ongoing  ? Target Date 05/22/21   ?  ? SLP LONG TERM GOAL #4  ? Title pt family will demo functional cueing strategies for pt expressive and receptive language, in 10 sessions   ? Baseline 12-29-20, 01-08-21, 01-14-21, 01-26-21, 03-02-21, 03-16-21, 04-06-21,  04-27-21   ? Time 8   ? Period Weeks   ? Status On-going   revised, and ongoing  ? Target Date 05/22/21   ?  ?  SLP LONG TERM GOAL #5  ? Title pt and family will report beginning to explore alternative/augmentative means of communication   ? Status Achieved   ? Target Date 04/30/21   ?  ? SLP LONG TERM GOAL #6  ? Title pt's family will demonstrate appropriate cueing techniques for pt during homework tasks, in 4 sessions   ? Baseline 04-13-21, 04-27-21   ? Time 8   ? Period Weeks   ? Status On-going   ? Target Date 05/22/21   ?  ? SLP LONG TERM GOAL #7  ? Title pt's family/friend will appropriately assist pt in 25 minutes of practice between 4 sessions   ? Baseline 04-13-21   ? Time 8   ? Period Weeks   ? Status On-going   and ongoing  ? Target Date 05/22/21   ? ?  ?  ? ?  ? ? ? Plan - 04/27/21 1737   ? ? Clinical Impression Statement Sophiah cont to present today with expressive and receptive aphasia, diagnosed primary progressive aphasia logopenic or semantic type, by Dr. Melvyn Novas after brain imaging and neuropsych testing. Pt continues to use compensatory strategies successfully and spontaneously. Completion of VNEST was downplayed today and active practice was suggested with PRW. She would cont to benefit from skilled ST every other week to continue to develop a practical home program in order to maximize/preserve remaining language skills, and begin to plan for the future communication needs (Lingraph[employer]?/lower tech augmentation?). Pt agrees with every other week. See "skilled intervention" for more details of this session.   ? Speech Therapy Frequency --   every other week  ? Duration --   90 days  ? Treatment/Interventions SLP instruction and feedback;Cognitive reorganization;Internal/external aids;Environmental controls;Compensatory strategies;Patient/family education;Functional tasks;Language facilitation;Cueing hierarchy;Multimodal communcation approach   ? Potential to Achieve Goals Good   ? Potential Considerations  Medical prognosis   ? ?  ?  ? ?  ? ? ?Patient will benefit from skilled therapeutic intervention in order to improve the following deficits and impairments:   ?Aphasia ? ?Cognitive communication deficit ?

## 2021-05-18 ENCOUNTER — Ambulatory Visit: Payer: 59

## 2021-06-01 ENCOUNTER — Ambulatory Visit: Payer: 59

## 2021-06-04 ENCOUNTER — Ambulatory Visit: Payer: 59 | Attending: Neurology

## 2021-06-04 DIAGNOSIS — R41841 Cognitive communication deficit: Secondary | ICD-10-CM | POA: Insufficient documentation

## 2021-06-04 DIAGNOSIS — R4701 Aphasia: Secondary | ICD-10-CM | POA: Insufficient documentation

## 2021-06-05 NOTE — Therapy (Signed)
[facility] [address] [place], [place], [address] Phone: [phone]   Fax:  [phone]  Speech Language Pathology Treatment/recertification  Patient Details  Name: Deanna [name] MRN: [id number] Date of Birth: September 09, 1960 Referring Provider (SLP): Narda Amber, MD   Encounter Date: 06/04/2021   End of Session - 06/05/21 1216     Visit Number 26    Number of Visits 35    Date for SLP Re-Evaluation 09/02/21    SLP Start Time 1533    SLP Stop Time  1618    SLP Time Calculation (min) 45 min    Activity Tolerance Patient tolerated treatment well             Past Medical History:  Diagnosis Date   Allergy    Chicken pox    Frequent headaches    Migraine    Urinary tract infection     Past Surgical History:  Procedure Laterality Date   [place]    There were no vitals filed for this visit.   Subjective Assessment - 06/05/21 1210     Subjective Pt took Lingraphica at end of session, with Deanna [name].    Patient is accompained by: Family member   Deanna [name]   Currently in Pain? No/denies                   ADULT SLP TREATMENT - 06/05/21 1210       General Information   Behavior/Cognition Alert;Cooperative;Pleasant mood;Requires cueing      Treatment Provided   Treatment provided Cognitive-Linquistic      Cognitive-Linquistic Treatment   Treatment focused on Aphasia;Patient/family/caregiver education    Skilled Treatment SLP began pt trial for Lingraphica - device rec'd yesterday. She will be out of town for the month of June and will return 07-13-21 to [place]. SLP initiated use of Lingraphica, demonstrating to pt and Deanna [name] how to program simple phrases for icon speech, how to drill down on icons/folders, how to create folders, delete impractical icons, and how to generate icon verbage and how to take photos for icon pictures. Pt initially req'd total assist but by session end req'd  min A occasionally for photo taking, drilling down in folders, and basic navigation on device. She req'd copying for icon verbage ("My family").      Assessment / Recommendations / Plan   Plan Goals updated      Progression Toward Goals   Progression toward goals Progressing toward goals   goals updated             SLP Education - 06/05/21 1216     Education Details see session note    Person(s) Educated Patient;Spouse    Methods Explanation;Demonstration;Verbal cues    Comprehension Verbalized understanding;Returned demonstration;Verbal cues required;Need further instruction              SLP Short Term Goals - 03/02/21 0930       SLP SHORT TERM GOAL #1   Title pt will develop a functional personally relevant word list and bring to [place] 5 sessions    Baseline 12-08-20, 12-10-20, 12-15-20, 12-17-20, 12-29-20, 01-08-21    Status Achieved    Target Date 01/02/21   extended one week due to holidays     SLP [place] #2   Title pt will track completion of a language home program, daily, for a segment of time (2 hours/day?) which works with her  schedule over 3 sessions    Baseline 12-08-20, 12-10-20    Status Achieved    Target Date 01/02/21      SLP SHORT TERM GOAL #3   Title pt will demo awareness of expressive errors, 50% of the time, given a nonverbal cue, in 3 sessions    Status Not Met    Target Date 01/02/21      SLP SHORT TERM GOAL #4   Title pt will generate her target word provided min SLP or family assistance, in 50% of opportunities in 2 sessions    Baseline 12-17-20    Status Achieved    Target Date 01/02/21      SLP SHORT TERM GOAL #5   Title pt will repeat and write words from her personally relevant word list with 80% accuracy, in 4 sessions    Baseline 12-17-20, 12-29-20, 01-08-21    Status Partially Met    Target Date 01/02/21              SLP Long Term Goals - 06/05/21 1219       SLP LONG TERM GOAL #1   Title pt will repeat and write  words from her personally relevant word list with 85% accuracy, in 4 sessions    Time 8    Period Weeks    Status Not Met      SLP LONG TERM GOAL #2   Title pt will demo awareness of expressive errors, 50% of the time, given a verbal cue, in 3 sessions    Time 8    Period Weeks    Status Not Met   and ongoing   Target Date 09/02/21      SLP LONG TERM GOAL #3   Title pt will generate her target word provided min-mod SLP or family assistance, in 70% of opportunities, x5 sessions    Baseline 01-14-21, 01-21-21, 02-25-21, 04-27-21    Status Partially Met   and ongoing     SLP LONG TERM GOAL #4   Title pt family will demo functional cueing strategies for pt expressive and receptive language, in 10 sessions    Baseline 12-29-20, 01-08-21, 01-14-21, 01-26-21, 03-02-21, 03-16-21, 04-06-21, 04-27-21    Time 8    Period Weeks    Status Partially Met   and ongoing     SLP LONG TERM GOAL #5   Title pt will demo basic use of Lingraphica for wants/needs PRN in 3 sessions    Time 90    Period Days    Status Achieved    Target Date 09/02/21      SLP LONG TERM GOAL #6   Title pt's family will demonstrate appropriate cueing techniques for pt during use of Lingraphica, in 3 sessions    Time 90    Period Days    Status Partially Met   and revised   Target Date 09/02/21      SLP LONG TERM GOAL #7   Title pt's family/friend will appropriately assist pt in 25 minutes of practice between 4 sessions    Baseline 04-13-21    Status Partially Met   and ongoing     SLP LONG TERM GOAL #8   Title pt will functionally communicate family names for clarification of pt stories 90% success, in 3 sessions    Time 90    Period Days    Status New    Target Date 09/02/21      SLP LONG TERM GOAL  #9  TITLE pt will communicate ideas about social and household activities functionally with use of Lingraphica with rare min A in 3 sessions    Time 90    Period Days    Status New    Target Date 09/02/21               Plan - 06/05/21 1216     Clinical Impression Statement Deanna [name] cont to present today with expressive and receptive aphasia, diagnosed primary progressive aphasia logopenic or semantic type, by Dr. Melvyn Novas after brain imaging and neuropsych testing. Pt continues to use compensatory strategies successfully and spontaneously. Lingraphica trial was initiated today - [employer] requires a 48-monthtrial before authorization can be given for device. Goals updated. She would benefit from skilled ST once a week to continue to develop a practical home program and in order to get training on use and setup/programming of Lingraphica device. in order to maximize/preserve remaining language skills, and begin to plan for the future communication needs. Pt agrees with once/week. See "skilled intervention" for more details of this session.    Speech Therapy Frequency 1x /week   every other week   Duration 8 weeks   beginning 07-13-21, when pt returns from vacation   Treatment/Interventions SLP instruction and feedback;Cognitive reorganization;Internal/external aids;Environmental controls;Compensatory strategies;Patient/family education;Functional tasks;Language facilitation;Cueing hierarchy;Multimodal communcation approach;Other (comment)   use of augmentative communication device   Potential to Achieve Goals Good    Potential Considerations Medical prognosis             Patient will benefit from skilled therapeutic intervention in order to improve the following deficits and impairments:   Aphasia - Plan: SLP plan of care cert/re-cert  Cognitive communication deficit - Plan: SLP plan of care cert/re-cert    Problem List Patient Active Problem List   Diagnosis Date Noted   Aphasia 08/05/2020   Neurocognitive disorder 08/05/2020   Elevated BP without diagnosis of hypertension 05/23/2020   Word finding difficulty 05/23/2020   Expressive speech disorder 05/23/2020   Acute pain of right knee 12/10/2017   Physical  exam 09/16/2017   Obesity (BMI 30.0-34.9) 09/16/2017    SSouthchase CMattawana5/26/2023, 12:28 PM  [facility] [address] [place] [address]Phone: [phone]  Fax:  [phone]  Name: Deanna MONTELONGOMRN: [id number] of Birth: 609/13/1962

## 2021-06-05 NOTE — Patient Instructions (Signed)
Lingraphica support number is on the device - call them ifyou have trouble. They are very good.

## 2021-07-13 ENCOUNTER — Ambulatory Visit: Payer: 59 | Attending: Neurology

## 2021-07-13 DIAGNOSIS — R4701 Aphasia: Secondary | ICD-10-CM | POA: Insufficient documentation

## 2021-07-13 DIAGNOSIS — R41841 Cognitive communication deficit: Secondary | ICD-10-CM | POA: Diagnosis present

## 2021-07-13 NOTE — Therapy (Signed)
[facility] [address], [place], [place], [address] Phone: [phone]   Fax:  [phone]  Speech Language Pathology Treatment  Patient Details  Name: Deanna [name] MRN: [id number] Date of Birth: 1960/01/14 Referring Provider (SLP): Narda Amber, MD   Encounter Date: 07/13/2021   End of Session - 07/13/21 1340     Visit Number 27    Number of Visits 35    Date for SLP Re-Evaluation 09/02/21    SLP Start Time 0806    SLP Stop Time  0846    SLP Time Calculation (min) 40 min    Activity Tolerance Patient tolerated treatment well             Past Medical History:  Diagnosis Date   Allergy    Chicken pox    Frequent headaches    Migraine    Urinary tract infection     Past Surgical History:  Procedure Laterality Date   [place]    There were no vitals filed for this visit.   Subjective Assessment - 07/13/21 1341     Subjective "I brought it. I like this a lot."    Patient is accompained by: Family member   husband   Currently in Pain? No/denies                   ADULT SLP TREATMENT - 07/13/21 1234       General Information   Behavior/Cognition Alert;Cooperative;Pleasant mood;Requires cueing      Treatment Provided   Treatment provided Cognitive-Linquistic      Cognitive-Linquistic Treatment   Treatment focused on Aphasia;Patient/family/caregiver education    Skilled Treatment Pt arrived today wiht at least 52 family members programmed into "family" tab. Pt had 2 trials before finding correct folder. She indicated to SLP that she has been practicing with this device and 95% of the time when looking at picture and the word stated the icon name correctly. SLP used this to highlight pt SHOULD BE using the device when experiencing anomia. Pt and husband indicated understanding of this and husband stated they would begin to do this when pt has anomia in conversation. SLP  showed pt and husband going out> places > stores for more practical practice in conversation - pt had difficulty communicating [employer] vs. [employer] yesterday. SLP educated further, pt and husband, how to add pt's verbal expression to icons, how to delete icons, copy and paste icons to different pages, and how to add icons in icon [place] to other pages. Pt scheduled next appointment for 08-17-21 when she will return to [place].      Assessment / Recommendations / Plan   Plan Continue with current plan of care      Progression Toward Goals   Progression toward goals Progressing toward goals                SLP Short Term Goals - 03/02/21 0930       SLP SHORT TERM GOAL #1   Title pt will develop a functional personally relevant word list and bring to [place] 5 sessions    Baseline 12-08-20, 12-10-20, 12-15-20, 12-17-20, 12-29-20, 01-08-21    Status Achieved    Target Date 01/02/21   extended one week due to holidays     SLP [place] #2   Title pt will track completion of a language home program, daily, for a segment of time (  2 hours/day?) which works with her schedule over 3 sessions    Baseline 12-08-20, 12-10-20    Status Achieved    Target Date 01/02/21      SLP SHORT TERM GOAL #3   Title pt will demo awareness of expressive errors, 50% of the time, given a nonverbal cue, in 3 sessions    Status Not Met    Target Date 01/02/21      SLP SHORT TERM GOAL #4   Title pt will generate her target word provided min SLP or family assistance, in 50% of opportunities in 2 sessions    Baseline 12-17-20    Status Achieved    Target Date 01/02/21      SLP SHORT TERM GOAL #5   Title pt will repeat and write words from her personally relevant word list with 80% accuracy, in 4 sessions    Baseline 12-17-20, 12-29-20, 01-08-21    Status Partially Met    Target Date 01/02/21              SLP Long Term Goals - 07/13/21 1342       SLP LONG TERM GOAL #1   Title pt will repeat and write  words from her personally relevant word list with 85% accuracy, in 4 sessions    Time 8    Period Weeks    Status Not Met      SLP LONG TERM GOAL #2   Title pt will demo awareness of expressive errors, 50% of the time, given a verbal cue, in 3 sessions    Time 8    Period Weeks    Status Not Met   and ongoing     SLP LONG TERM GOAL #3   Title pt will generate her target word provided min-mod SLP or family assistance, in 70% of opportunities, x5 sessions    Baseline 01-14-21, 01-21-21, 02-25-21, 04-27-21    Status Partially Met   and ongoing     [place] #4   Title pt family will demo functional cueing strategies for pt expressive and receptive language, in 10 sessions    Baseline 12-29-20, 01-08-21, 01-14-21, 01-26-21, 03-02-21, 03-16-21, 04-06-21, 04-27-21    Time 8    Period Weeks    Status Partially Met   and ongoing     SLP LONG TERM GOAL #5   Title pt will demo basic use of Lingraphica for wants/needs PRN in 3 sessions    Time 90    Period Days    Status Achieved      SLP LONG TERM GOAL #6   Title pt's family will demonstrate appropriate cueing techniques for pt during use of Lingraphica, in 3 sessions    Time 90    Period Days    Status Partially Met   and revised     SLP LONG TERM GOAL #7   Title pt's family/friend will appropriately assist pt in 25 minutes of practice between 4 sessions    Baseline 04-13-21    Status Partially Met   and ongoing     SLP LONG TERM GOAL #8   Title pt will functionally communicate family names for clarification of pt stories 90% success, in 3 sessions    Baseline 07-13-21    Time 90    Period Days    Status On-going      SLP LONG TERM GOAL  #9   TITLE pt will communicate ideas about social and household activities functionally with use  of Lingraphica with rare min A in 3 sessions    Time 90    Period Days    Status On-going    Target Date 09/02/21              Plan - 07/13/21 1341     Clinical Impression Statement Deanna [name] cont  to present today with expressive and receptive aphasia, diagnosed primary progressive aphasia logopenic or semantic type, by Dr. Melvyn Novas after brain imaging and neuropsych testing. Pt continues to use compensatory strategies successfully and spontaneously. Lingraphica trial was initiated today - [employer] requires a 43-monthtrial before authorization can be given for device. She would benefit from skilled ST once a week to continue to develop a practical home program and in order to get training on use and setup/programming of Lingraphica device. in order to maximize/preserve remaining language skills, and begin to plan for the future communication needs. Pt agrees with once/week. See "skilled intervention" for more details of this session.    Speech Therapy Frequency 1x /week   every other week   Duration 8 weeks   beginning 07-13-21, when pt returns from vacation   Treatment/Interventions SLP instruction and feedback;Cognitive reorganization;Internal/external aids;Environmental controls;Compensatory strategies;Patient/family education;Functional tasks;Language facilitation;Cueing hierarchy;Multimodal communcation approach;Other (comment)   use of augmentative communication device   Potential to Achieve Goals Good    Potential Considerations Medical prognosis             Patient will benefit from skilled therapeutic intervention in order to improve the following deficits and impairments:   Aphasia  Cognitive communication deficit    Problem List Patient Active Problem List   Diagnosis Date Noted   Aphasia 08/05/2020   Neurocognitive disorder 08/05/2020   Elevated BP without diagnosis of hypertension 05/23/2020   Word finding difficulty 05/23/2020   Expressive speech disorder 05/23/2020   Acute pain of right knee 12/10/2017   Physical exam 09/16/2017   Obesity (BMI 30.0-34.9) 09/16/2017    SPunta Rassa CDel Muerto7/03/2021, 1:43 PM  [facility] [address] [place] [address]Phone: [phone]  Fax:  [phone]  Name: Deanna DARDISMRN: [id number] of Birth: [birth year]

## 2021-08-17 ENCOUNTER — Ambulatory Visit: Payer: 59 | Attending: Neurology

## 2021-08-17 DIAGNOSIS — R4701 Aphasia: Secondary | ICD-10-CM | POA: Diagnosis present

## 2021-08-17 DIAGNOSIS — R41841 Cognitive communication deficit: Secondary | ICD-10-CM | POA: Insufficient documentation

## 2021-08-17 NOTE — Therapy (Signed)
[facility] [address], [place], [place], [address] Phone: [phone]   Fax:  [phone]  Speech Language Pathology Treatment  Patient Details  Name: Deanna [name] MRN: [id number] Date of Birth: 29-May-1960 Referring Provider (SLP): Narda Amber, MD   Encounter Date: 08/17/2021   End of Session - 08/17/21 0833     Visit Number 28    Number of Visits 35    Date for SLP Re-Evaluation 09/02/21    SLP Start Time 0804    SLP Stop Time  0845    SLP Time Calculation (min) 41 min    Activity Tolerance Patient tolerated treatment well             Past Medical History:  Diagnosis Date   Allergy    Chicken pox    Frequent headaches    Migraine    Urinary tract infection     Past Surgical History:  Procedure Laterality Date   [place]    There were no vitals filed for this visit.   Subjective Assessment - 08/17/21 1105     Subjective "We go up to the doctor - he was up there, then we are back here." Pt saw Dr. Voncille Lo, psychiatrist at [facility], specializing in cognitive and linguistic disorders. Dr. Gershon Mussel "is not convinced it's aphasia."    Patient is accompained by: Family member   husband   Currently in Pain? No/denies                   ADULT SLP TREATMENT - 08/17/21 1105       General Information   Behavior/Cognition Alert;Cooperative;Pleasant mood;Requires cueing;Doesn't follow directions      Treatment Provided   Treatment provided Cognitive-Linquistic      Cognitive-Linquistic Treatment   Treatment focused on Aphasia;Patient/family/caregiver education    Skilled Treatment Pt arrived today with approx 30 people into "family" tab - she has made self-spoken audio for 85% of these icons. SLP asked pt simple question related to these icons but not in a context assumed for family/friends, which req'd written and gesture cues for pt to  understand SLP. SLP again highlighted to pt and husband that [place] using the device when experiencing anomia. Pt will be relocating to [place] in 6 weeks; SLP suggested she get established with ST there and see this SLP when she returns in late 2024.      Assessment / Recommendations / Plan   Plan Continue with current plan of care   d/c likely next visit due to pt relocation     Progression Toward Goals   Progression toward goals Progressing toward goals           Note from Dr. Voncille Lo in July 2023:  "This is a complex presentation. There is clearly a progressive cognitive decline which makes a neurodegeneration etiology most likely. Currently, the most prominent cognitive impairment is in the language domain in which semantic aphasia with severe 2 way anomia, single word comprehension impairments, and object recognition impairment are present (consistent with semantic dementia), but also there is subtle agrammatism, inability to repeat short sentences, severe reduction in both LF and SF, and paraphasias. There is also evidence of comportment changes as seen in non-dominant temporal pole degeneration but also can be seen in bvFTD syndromes. There is some weakness also in executive functions. On note, memory functions (except for verbal memory) are overall  normal as seen in intact non-verbal memory tests. No clear evidence of prosopagnosia. Also, VS functions were overall intact. This presentation can be seen in bilateral TDP-C temporal poles neurodegeneration, but the speed of progression and wide spread extent of cognitive impairments are somehow not typical for this diagnosis. AD is possible here, but the clear salince of semantic dysfunction and intact non-verbal memory is not typical for AD. Finally, tauopathies are also on the differential but the presentation is not fully consisten with either isolated bvFTD nor with 4R tauopathies."    SLP Short Term Goals - 03/02/21 0930        SLP SHORT TERM GOAL #1   Title pt will develop a functional personally relevant word list and bring to [place] 5 sessions    Baseline 12-08-20, 12-10-20, 12-15-20, 12-17-20, 12-29-20, 01-08-21    Status Achieved    Target Date 01/02/21   extended one week due to holidays     SLP [place] #2   Title pt will track completion of a language home program, daily, for a segment of time (2 hours/day?) which works with her schedule over 3 sessions    Baseline 12-08-20, 12-10-20    Status Achieved    Target Date 01/02/21      SLP SHORT TERM GOAL #3   Title pt will demo awareness of expressive errors, 50% of the time, given a nonverbal cue, in 3 sessions    Status Not Met    Target Date 01/02/21      SLP SHORT TERM GOAL #4   Title pt will generate her target word provided min SLP or family assistance, in 50% of opportunities in 2 sessions    Baseline 12-17-20    Status Achieved    Target Date 01/02/21      SLP SHORT TERM GOAL #5   Title pt will repeat and write words from her personally relevant word list with 80% accuracy, in 4 sessions    Baseline 12-17-20, 12-29-20, 01-08-21    Status Partially Met    Target Date 01/02/21              SLP Long Term Goals - 08/17/21 1138       SLP LONG TERM GOAL #1   Title pt will repeat and write words from her personally relevant word list with 85% accuracy, in 4 sessions    Time 8    Period Weeks    Status Not Met      SLP LONG TERM GOAL #2   Title pt will demo awareness of expressive errors, 50% of the time, given a verbal cue, in 3 sessions    Time 8    Period Weeks    Status Not Met   and ongoing     SLP LONG TERM GOAL #3   Title pt will generate her target word provided min-mod SLP or family assistance, in 70% of opportunities, x5 sessions    Baseline 01-14-21, 01-21-21, 02-25-21, 04-27-21    Status Partially Met   and ongoing     SLP LONG TERM GOAL #4   Title pt family will demo functional cueing strategies for pt expressive and  receptive language, in 10 sessions    Baseline 12-29-20, 01-08-21, 01-14-21, 01-26-21, 03-02-21, 03-16-21, 04-06-21, 04-27-21    Time 8    Period Weeks    Status Partially Met   and ongoing     SLP LONG TERM GOAL #5   Title pt will demo basic use  of Lingraphica for wants/needs PRN in 3 sessions    Time 90    Period Days    Status Achieved      SLP LONG TERM GOAL #6   Title pt's family will demonstrate appropriate cueing techniques for pt during use of Lingraphica, in 3 sessions    Time 90    Period Days    Status Partially Met   and revised     SLP LONG TERM GOAL #7   Title pt's family/friend will appropriately assist pt in 25 minutes of practice between 4 sessions    Baseline 04-13-21    Status Partially Met   and ongoing     SLP LONG TERM GOAL #8   Title pt will functionally communicate family names for clarification of pt stories 90% success, in 3 sessions    Baseline 07-13-21    Time 90    Period Days    Status On-going    Target Date 09/02/21      SLP LONG TERM GOAL  #9   TITLE pt will communicate ideas about social and household activities functionally with use of Lingraphica with rare min A in 3 sessions    Time 90    Period Days    Status On-going              Plan - 08/17/21 1134     Clinical Impression Statement Deanna [name] cont to present today with expressive and receptive aphasia, diagnosed primary progressive aphasia logopenic or semantic type, by Dr. Melvyn Novas after brain imaging and neuropsych testing. Pt used compensatory expressive language strategies spontaneously such as her phone for location, and her [place] for naming people. She would benefit from skilled ST for one more session to cont to train on use and programming of Lingraphica device. Pt will likely be d/c'd next session due to relocation. Due to vacations this summer, she has largely been seen monthly, and this will continue for the next session. See "skilled intervention" for more details of this session.     Speech Therapy Frequency Monthly   every other week   Duration 8 weeks   beginning 07-13-21, when pt returns from vacation   Treatment/Interventions SLP instruction and feedback;Cognitive reorganization;Internal/external aids;Environmental controls;Compensatory strategies;Patient/family education;Functional tasks;Language facilitation;Cueing hierarchy;Multimodal communcation approach;Other (comment)   use of augmentative communication device   Potential to Achieve Goals Good    Potential Considerations Medical prognosis             Patient will benefit from skilled therapeutic intervention in order to improve the following deficits and impairments:   Aphasia  Cognitive communication deficit    Problem List Patient Active Problem List   Diagnosis Date Noted   Aphasia 08/05/2020   Neurocognitive disorder 08/05/2020   Elevated BP without diagnosis of hypertension 05/23/2020   Word finding difficulty 05/23/2020   Expressive speech disorder 05/23/2020   Acute pain of right knee 12/10/2017   Physical exam 09/16/2017   Obesity (BMI 30.0-34.9) 09/16/2017    [place], [place] 08/17/2021, 11:39 AM  [facility] [address], [place], [place], [address] Phone: [phone]   Fax:  [phone]   Name: Deanna [name] MRN: [id number] Date of Birth: 05-09-1960

## 2021-08-17 NOTE — Patient Instructions (Signed)
  I recommend getting established with a SLP in the [place] area, preferably someone with experience with a Lingraphica device.  [facility] speech and hearing clinic might also be an option if you are going as little as once every other week/ every month.

## 2021-09-23 ENCOUNTER — Ambulatory Visit: Payer: 59

## 2021-10-13 ENCOUNTER — Telehealth: Payer: Self-pay | Admitting: Neurology

## 2021-10-13 NOTE — Telephone Encounter (Signed)
Jenna from [employer] called in wanting to follow up on a speech pathologist report along with an order for a device.

## 2021-10-13 NOTE — Telephone Encounter (Signed)
Called Jenna at [employer] and left a message informing her that she has contacted the wrong office as Dr. Posey Pronto has not been the ordering physician of this. Left contact information if she needs to contact me back.

## 2022-03-24 ENCOUNTER — Encounter: Payer: Self-pay | Admitting: Family Medicine

## 2022-03-24 ENCOUNTER — Telehealth: Payer: Self-pay | Admitting: Family Medicine

## 2022-03-24 ENCOUNTER — Ambulatory Visit: Payer: 59 | Admitting: Family Medicine

## 2022-03-24 VITALS — BP 118/78 | HR 77 | Temp 98.2°F | Resp 17 | Ht 66.0 in | Wt 298.0 lb

## 2022-03-24 DIAGNOSIS — G3109 Other frontotemporal dementia: Secondary | ICD-10-CM

## 2022-03-24 DIAGNOSIS — R400 Somnolence: Secondary | ICD-10-CM

## 2022-03-24 DIAGNOSIS — G3101 Pick's disease: Secondary | ICD-10-CM

## 2022-03-24 DIAGNOSIS — F0280 Dementia in other diseases classified elsewhere without behavioral disturbance: Secondary | ICD-10-CM

## 2022-03-24 NOTE — Telephone Encounter (Signed)
Caller name: [name] son   On DPR?: Yes  Call back number: [phone]  Provider they see: Midge Minium, MD  Reason for call: Deanna [name] son is calling asking for you to call him about his mother's medication situation. Mother can be on the phone but can't really speak because of disease process.

## 2022-03-24 NOTE — Telephone Encounter (Signed)
This needs to be an appt and pt needs to be present.  I have not seen her since 2022.

## 2022-03-24 NOTE — Patient Instructions (Signed)
Once we have the lab results we can determine the next steps We'll call you to schedule your sleep consultation at [facility] call you to schedule your Neurology appt at [facility] are doing AMAZING!! Hang in there!  We're going to keep digging!

## 2022-03-24 NOTE — Telephone Encounter (Signed)
Pt has been scheduled for this afternoon

## 2022-03-24 NOTE — Progress Notes (Unsigned)
   Subjective:    Patient ID: Deanna [name], female    DOB: 24-Feb-1960, 62 y.o.   MRN: [id number]  HPI svPPA- pt currently receiving treatment at [facility].  Traveling monthly to [place].  In November was told by treating physician that they wanted a sleep study prior to starting medication (Ritalin).  Currently doing speech therapy.  Due to decreased ability to participate in activities is having excessive fatigue and sleepiness.  Pt's receptive ability exceeds her verbal ability.  Will call most people close to her '[place] which is her son's name.  Will have pain in L temple 'at least once a week' but has a hard time verbalizing that.  Pt did have hallucinations 4 weeks ago while visiting husband in [place].  MD at [facility] made aware and indicated this was 'normal' but no other advice or tx options given.  Thankfully hallucinations have not returned since that time.     Review of Systems For ROS see HPI     Objective:   Physical Exam Vitals reviewed.  Constitutional:      General: She is not in acute distress.    Appearance: Normal appearance. She is obese. She is not ill-appearing.  HENT:     Head: Normocephalic and atraumatic.  Eyes:     Extraocular Movements: Extraocular movements intact.     Conjunctiva/sclera: Conjunctivae normal.     Pupils: Pupils are equal, round, and reactive to light.  Cardiovascular:     Rate and Rhythm: Normal rate and regular rhythm.  Pulmonary:     Effort: Pulmonary effort is normal. No respiratory distress.  Skin:    General: Skin is warm and dry.  Neurological:     Mental Status: She is alert.     Cranial Nerves: No cranial nerve deficit.     Coordination: Coordination normal.     Gait: Gait normal.     Comments: Pt not able to answer questions appropriately as the words she uses don't fit           Assessment & Plan:

## 2022-03-25 ENCOUNTER — Telehealth: Payer: Self-pay

## 2022-03-25 DIAGNOSIS — F0280 Dementia in other diseases classified elsewhere without behavioral disturbance: Secondary | ICD-10-CM | POA: Insufficient documentation

## 2022-03-25 DIAGNOSIS — E669 Obesity, unspecified: Secondary | ICD-10-CM | POA: Insufficient documentation

## 2022-03-25 DIAGNOSIS — R400 Somnolence: Secondary | ICD-10-CM | POA: Insufficient documentation

## 2022-03-25 LAB — CBC WITH DIFFERENTIAL/PLATELET
Basophils Absolute: 0.1 10*3/uL (ref 0.0–0.1)
Basophils Relative: 0.7 % (ref 0.0–3.0)
Eosinophils Absolute: 0.2 10*3/uL (ref 0.0–0.7)
Eosinophils Relative: 2.1 % (ref 0.0–5.0)
HCT: 42.2 % (ref 36.0–46.0)
Hemoglobin: 14.3 g/dL (ref 12.0–15.0)
Lymphocytes Relative: 42.7 % (ref 12.0–46.0)
Lymphs Abs: 3.5 10*3/uL (ref 0.7–4.0)
MCHC: 33.8 g/dL (ref 30.0–36.0)
MCV: 89.4 fl (ref 78.0–100.0)
Monocytes Absolute: 0.6 10*3/uL (ref 0.1–1.0)
Monocytes Relative: 6.8 % (ref 3.0–12.0)
Neutro Abs: 3.9 10*3/uL (ref 1.4–7.7)
Neutrophils Relative %: 47.7 % (ref 43.0–77.0)
Platelets: 299 10*3/uL (ref 150.0–400.0)
RBC: 4.72 Mil/uL (ref 3.87–5.11)
RDW: 13.1 % (ref 11.5–15.5)
WBC: 8.2 10*3/uL (ref 4.0–10.5)

## 2022-03-25 LAB — BASIC METABOLIC PANEL
BUN: 12 mg/dL (ref 6–23)
CO2: 28 mEq/L (ref 19–32)
Calcium: 9.2 mg/dL (ref 8.4–10.5)
Chloride: 104 mEq/L (ref 96–112)
Creatinine, Ser: 0.75 mg/dL (ref 0.40–1.20)
GFR: 86.34 mL/min (ref >60.00)
Glucose, Bld: 83 mg/dL (ref 70–99)
Potassium: 3.8 mEq/L (ref 3.5–5.1)
Sodium: 140 mEq/L (ref 135–145)

## 2022-03-25 LAB — LIPID PANEL
Cholesterol: 174 mg/dL (ref 0–200)
HDL: 41.4 mg/dL (ref >39.00)
NonHDL: 132.39
Total CHOL/HDL Ratio: 4
Triglycerides: 243 mg/dL — ABNORMAL HIGH (ref 0.0–149.0)
VLDL: 48.6 mg/dL — ABNORMAL HIGH (ref 0.0–40.0)

## 2022-03-25 LAB — LDL CHOLESTEROL, DIRECT: Direct LDL: 112 mg/dL

## 2022-03-25 LAB — HEPATIC FUNCTION PANEL
ALT: 22 U/L (ref 0–35)
AST: 20 U/L (ref 0–37)
Albumin: 4 g/dL (ref 3.5–5.2)
Alkaline Phosphatase: 102 U/L (ref 39–117)
Bilirubin, Direct: 0 mg/dL (ref 0.0–0.3)
Total Bilirubin: 0.3 mg/dL (ref 0.2–1.2)
Total Protein: 7 g/dL (ref 6.0–8.3)

## 2022-03-25 LAB — TSH: TSH: 2.43 u[IU]/mL (ref 0.35–5.50)

## 2022-03-25 LAB — B. BURGDORFI ANTIBODIES: B burgdorferi Ab IgG+IgM: <0.9 index

## 2022-03-25 LAB — HEMOGLOBIN A1C: Hgb A1c MFr Bld: 6.1 % (ref 4.6–6.5)

## 2022-03-25 NOTE — Assessment & Plan Note (Signed)
New.  The thought is that since she is not able to engage in things, she has become sedentary and is struggling w/ excessive sleepiness.  This may be out of boredom but pt is not able to relay this information.  Doctor's at [facility] wanted a sleep study but said there was no appt until July (this was recommended in the fall).  Will refer for local sleep evaluation and check labs to r/o metabolic cause.

## 2022-03-25 NOTE — Telephone Encounter (Signed)
-----   Message from Katherine E Tabori, MD sent at 03/25/2022 12:22 PM EDT ----- Labs look good!  Triglycerides (fatty part of the blood) are elevated but this is because you were not fasting at the time of the lab draw.  Thyroid is normal  A1C shows that you are in the prediabetes range.  This will improve w/ low carb/low sugar diet and regular physical activity such as walking.  Waiting on Lyme disease labs 

## 2022-03-25 NOTE — Telephone Encounter (Signed)
Referral placed.

## 2022-03-25 NOTE — Assessment & Plan Note (Signed)
New.  Pt has gained considerable weight since her last visit 2 yrs ago, but due to her neurocognitive impairments, she is mostly sedentary at this time.  Will  check labs to risk stratify and start medications if needed.

## 2022-03-25 NOTE — Telephone Encounter (Signed)
Called the son back and explained his mother's labs and answered all of his concerns and questions

## 2022-03-25 NOTE — Telephone Encounter (Signed)
Kenney Houseman spoke to pt son Mortimer Fries with results and  he is asking for a nation referral can we place

## 2022-03-25 NOTE — Telephone Encounter (Signed)
-----   Message from Midge Minium, MD sent at 03/25/2022 12:22 PM EDT ----- Labs look good!  Triglycerides (fatty part of the blood) are elevated but this is because you were not fasting at the time of the lab draw.  Thyroid is normal  A1C shows that you are in the prediabetes range.  This will improve w/ low carb/low sugar diet and regular physical activity such as walking.  Waiting on Lyme disease labs

## 2022-03-25 NOTE — Assessment & Plan Note (Signed)
Dx'd at [facility] in [place].  Pt has been undergoing speech therapy there monthly and having serial MRI's but has not had an EEG or Lyme test.  Will refer to local academic neuro [facility]) and do Lyme testing today.  Family is frustrated that this has been going on for a year w/ little to no intervention.  Will follow closely.

## 2022-03-25 NOTE — Assessment & Plan Note (Signed)
New.  Again dx'd at [facility] in [place].  Has been getting speech tx and serial MRI's but no other treatments have been initiated.  They are struggling logistically to go back and forth between here and [place].  Pt is totally dependent on 61 yr old son and not able to live alone.  Husband is currently working in [place] on a project and will hopefully be returning home within the year.  Will refer to local academic Neuro for complete evaluation- hopefully including an EEG as this has never been done.  Will continue to follow closely.  Total time spent w/ pt and son, 42 minutes.

## 2022-03-25 NOTE — Telephone Encounter (Signed)
Patient called back to get a better understanding of his mom lab results. [phone]

## 2022-03-26 ENCOUNTER — Telehealth: Payer: Self-pay

## 2022-03-26 NOTE — Telephone Encounter (Signed)
-----   Message from Midge Minium, MD sent at 03/26/2022  7:30 AM EDT ----- No evidence of Lyme disease

## 2022-03-26 NOTE — Telephone Encounter (Signed)
Left son Deanna [name] a Vm stating lab results

## 2022-03-30 ENCOUNTER — Telehealth: Payer: Self-pay

## 2022-03-30 ENCOUNTER — Telehealth: Payer: Self-pay | Admitting: Family Medicine

## 2022-03-30 ENCOUNTER — Ambulatory Visit (INDEPENDENT_AMBULATORY_CARE_PROVIDER_SITE_OTHER): Payer: 59 | Admitting: Neurology

## 2022-03-30 ENCOUNTER — Encounter: Payer: Self-pay | Admitting: Neurology

## 2022-03-30 VITALS — BP 147/80 | HR 71 | Ht 65.0 in | Wt 218.6 lb

## 2022-03-30 DIAGNOSIS — R400 Somnolence: Secondary | ICD-10-CM

## 2022-03-30 DIAGNOSIS — R419 Unspecified symptoms and signs involving cognitive functions and awareness: Secondary | ICD-10-CM | POA: Diagnosis not present

## 2022-03-30 NOTE — Telephone Encounter (Signed)
Caller name: Tenee Marchant   On [place]?: Yes  Call back number: [phone]  Provider they see: Midge Minium, MD  Reason for call: [name] states that Sleep Neuro schedule his mom appt for today. [name] states that his mom has to do at home sleep study. [name] would like for Danae Chen to contact him at [phone]. [name] states that he has a couple questions for [employer].

## 2022-03-30 NOTE — Telephone Encounter (Signed)
error 

## 2022-03-30 NOTE — Telephone Encounter (Signed)
Spoke to pt's son at length, I have provided him with an update on the neurology referral and I provided him with the contact # for the nut. And diabetes office.  I advised pt that he can call back if he has any additional questions and I can be the point of contact for the referrals.   He was very thankful.

## 2022-03-30 NOTE — Progress Notes (Signed)
SLEEP MEDICINE CLINIC    Provider:  Larey Seat, MD  Primary Care Physician:  Midge Minium, MD [address] [place] [address]     Referring Provider:   PRIMARY NEUROLOGIST REMAINS Dr Posey Pronto, Arvin Collard, DO.          Chief Complaint according to patient   Patient presents with:     New Patient (Initial Visit)     Here with son Deanna [name], who is 9, and in a gap year.       HISTORY OF PRESENT ILLNESS:  Deanna [name] is a 62 y.o. female patient who is seen upon referral on 03/30/2022 from PCP for a sleep consultation. November 2023 the patient received her diagnosis of PPA and fronto temporal changes. The  Neurology specialist at [facility] asked for a POLYSOMNOGRAPHY and the results would influence her future medication such as ritalin,  which is indicated for reward and initiation of verbal clues.  Dr Voncille Lo ,MD , at [facility] who referred to genetics, ST and OT , an only the sleep study is pending.   Chief concern according to patient:   Frustrated by prolonged work-up of her symptoms locally, finally received Dx at [facility] in [place], semantic variant primary progressive Aphasia.    I have the pleasure of seeing Deanna [name] 03/30/22 a right -handed female with a possible sleep disorder.  Her family noted more naps in daytime, not sure about her sleep continuity - she has aphasia related difficulties to describe her sleep to me. It is apparently fragmented sleep with difficulties to re-initiate sleep.  Husband is working in [place], they live in [place], and had  a medical work up in [place]    Sleep relevant medical history: No Nocturia, Tonsillectomy, Obesity, wisdom teeth extraction. .     Family medical /sleep history: 2 brothers, younger than the patient, both parents alive-  both parents on CPAP.    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  No pets.  Tobacco use- none .  ETOH use ; seldomly,  Caffeine intake in form of Coffee( 1 cup in AM) Soda( /) Tea ( /) or energy drinks Exercise in form of walking.    Sleep habits are as follows: The patient's dinner time is between 5-7 PM. The patient goes to bed at 10 PM and continues to sleep for 7-9 hours, wakes for drinking water - dry mouth.  The preferred sleep position is variable , with the support of one pillow. Dreams are reportedly frequent/vivid=.   The patient wakes up spontaneously. 7.30   AM is the usual rise time. She reports not feeling refreshed or restored in AM, with symptoms such as dry mouth but she is not aware if she snores- , some morning headaches, and residual fatigue. Naps are taken recently more frequently, lasting from 30 to 60 minutes and are described as refreshing . She reports waking up with a dry mouth.   02-27-2021: Deanna [name] is a 62 y.o., left-hand dominant, married woman with a history of aphasia (mostly word finding problems). The patient and her husband noticed her symptoms in November, 2021 shortly  after she received her third [place] vaccination although the patient's son noticed changes in May, 2021 even before that. The main difficulty the patient and her husband report are word finding problems, word substitution, and there may be some subtle changes in her ability to do higher-level activities (e.g., she had a very hard time figuring out how to apply for unemployment when she quit her last job).    On exam, [name] demonstrated notable language problems that likely undermined her performance on most verbal tasks and may have also affected non-verbal performance given difficulties with test-task instructions. She had qualitatively fluent speech with markedly impaired naming,  repetition, and weak comprehension (able to follow simple two-step commands but not complex conditional commands). Qualitative features included possible surface dyslexia on single-word reading as well as semantic and phonologic paraphasias. She had no articulatory issues, speech praxis problems, slow effortful speech or changes in prosody. Likewise, she did not show single word comprehension deficits, loss of object knowledge, or two-way naming problems. Beyond that she is showing impaired performance on measures of memory and executive abilities. Verbal memory deficits may be on the basis of language problems but I think there are primary problems as well, because she also had difficulties with delayed recall of verbal information. She screened negative for the presence of depression and was characterized as functioning at a mild cognitive impairment level problem.    [name] is thus demonstrating a language problem characterized by deficient naming and repetition. She was also noted to have fairly significant comprehension deficits but she has no problems with single-word comprehension. Her presentation is concerning for a Primary Progressive Aphasia. She most closely resembles the logopenic subtype type and has some supportive features (e.g., memory impairment), although she does have some less characteristic findings in terms of surface dyslexia and the extent of her comprehension difficulties, which may make this an unclassifiable phenotype as per current criteria. Other etiologies need to be ruled out prior to definitive diagnosis and she certainly needs an MRI of the brain. This may be a particularly helpful diagnostic study in her case given the differential and I am happy to review the images with her if desired. She may benefit from speech and language pathology for rehabilitation and/or compensatory communication strategies.      Review of Systems: Out of a complete 14 system review, the patient  complains of only the following symptoms, and all other reviewed systems are negative.:  Fatigue, sleepiness , unclear if snoring, fragmented sleep, Insomnia,    APHASIA  How likely are you to doze in the following situations: 0 = not likely, 1 = slight chance, 2 = moderate chance, 3 = high chance   Sitting and Reading? Watching Television? Sitting inactive in a public place (theater or meeting)? As a passenger in a car for an hour without a break? Lying down in the afternoon when circumstances permit? Sitting and talking to someone? Sitting quietly after lunch without alcohol? In a car, while stopped for a few minutes in traffic?   Total = 5/ 24 points   FSS endorsed at 15/ 63 points.   Social History   Socioeconomic History   Marital status: Married    Spouse name: Not on file   Number of children: 3   Years of education: Not on file   Highest education level: Not on file  Occupational History   Not on file  Tobacco Use   Smoking status: Never   Smokeless tobacco: Never  Vaping Use   Vaping Use: Never used  Substance and Sexual Activity   Alcohol use: Yes    Alcohol/week: 1.0 standard drink of alcohol    Types: 1 Glasses of wine per week    Comment: Per Week   Drug use: Never   Sexual activity: Yes  Other Topics Concern   Not on file  Social History Narrative   Left Handed    Lives in a two story home    Social Determinants of Health   Financial Resource Strain: Not on file  Food Insecurity: Not on file  Transportation Needs: Not on file  Physical Activity: Not on file  Stress: Not on file  Social Connections: Not on file    Family History  Problem Relation Age of Onset   Arthritis Mother    Asthma Mother    Hyperlipidemia Mother    Hearing loss Father    Hyperlipidemia Father    Hypertension Father    Heart Problems Father        Psychologist, forensic   Macular degeneration Father    Heart attack Maternal Grandmother    Heart disease Maternal Grandmother     Hyperlipidemia Maternal Grandmother    Hypertension Maternal Grandmother    Early death Maternal Grandfather    Heart disease Paternal Grandmother    Hyperlipidemia Paternal Grandmother    Hypertension Paternal Grandmother    Heart attack Paternal Grandmother    Cancer Paternal Grandfather    Stroke Paternal Grandfather     Past Medical History:  Diagnosis Date   Allergy    Chicken pox    Frequent headaches    Migraine    Urinary tract infection     Past Surgical History:  Procedure Laterality Date   Pratt     Current Outpatient Medications on File Prior to Visit  Medication Sig Dispense Refill   naproxen sodium (ALEVE) 220 MG tablet Take 220 mg by mouth.     zinc gluconate 50 MG tablet Take 50 mg by mouth daily.     b complex vitamins capsule Take 1 capsule by mouth daily. (Patient not taking: Reported on 03/30/2022)     Cholecalciferol (D3 ADULT PO) Take by mouth. (Patient not taking: Reported on 03/30/2022)     fluoruracil (CARAC) 0.5 % cream Apply topically daily. (Patient not taking: Reported on 03/30/2022)     Multiple Vitamins-Minerals (MULTIVITAMIN ADULT PO) Take 1 tablet by mouth daily. B1 (Patient not taking: Reported on 03/30/2022)     No current facility-administered medications on file prior to visit.    Allergies  Allergen Reactions   Pollen Extract     Seasonal allergies   Diclofenac Rash    Rash, headache (oral diclofenac).     DIAGNOSTIC DATA (LABS, IMAGING, TESTING) - I reviewed patient records, labs, notes, testing and imaging myself where available.  Lab Results  Component Value Date   WBC 8.2 03/24/2022   HGB 14.3 03/24/2022   HCT 42.2 03/24/2022   MCV 89.4 03/24/2022   PLT 299.0 03/24/2022      Component Value Date/Time   NA 140 03/24/2022 1542   K 3.8 03/24/2022 1542   CL 104 03/24/2022 1542   CO2 28 03/24/2022 1542   GLUCOSE 83 03/24/2022 1542   BUN 12 03/24/2022 1542   CREATININE 0.75  03/24/2022 1542   CALCIUM 9.2 03/24/2022 1542   PROT 7.0 03/24/2022 1542   ALBUMIN 4.0 03/24/2022 1542   AST  20 03/24/2022 1542   ALT 22 03/24/2022 1542   ALKPHOS 102 03/24/2022 1542   BILITOT 0.3 03/24/2022 1542   GFRNONAA >60 02/18/2019 1247   GFRAA >60 02/18/2019 1247   Lab Results  Component Value Date   CHOL 174 03/24/2022   HDL 41.40 03/24/2022   LDLCALC 99 05/23/2020   LDLDIRECT 112.0 03/24/2022   TRIG 243.0 (H) 03/24/2022   CHOLHDL 4 03/24/2022   Lab Results  Component Value Date   HGBA1C 6.1 03/24/2022   Lab Results  Component Value Date   [id number] 05/23/2020   Lab Results  Component Value Date   TSH 2.43 03/24/2022    PHYSICAL EXAM:  Today's Vitals   03/30/22 0849  BP: (!) 147/80  Pulse: 71  Weight: 218 lb 9.6 oz (99.2 kg)  Height: 5\' 5"  (1.651 m)   Body mass index is 36.38 kg/m.   Wt Readings from Last 3 Encounters:  03/30/22 218 lb 9.6 oz (99.2 kg)  03/24/22 298 lb (135.2 kg)  11/12/20 208 lb (94.3 kg)     Ht Readings from Last 3 Encounters:  03/30/22 5\' 5"  (1.651 m)  03/24/22 5\' 6"  (1.676 m)  11/12/20 5\' 6"  (1.676 m)      General: The patient is awake, alert and appears not in acute distress. The patient is well groomed. Head: Normocephalic, atraumatic. Neck is supple.  Mallampati 3,  neck circumference:15.5  inches . Nasal airflow  patent.  No retrognathia is seen.  Dental status: braces were worn in her youth.  Cardiovascular:  Regular rate and cardiac rhythm by pulse,  without distended neck veins. Respiratory: Lungs are clear to auscultation.  Skin:  Without evidence of ankle edema, or rash. Trunk: The patient's posture is erect.   NEUROLOGIC EXAM: The patient is awake and alert, oriented to place and time.   Speech is non-fluent,  aphasia.  Mood and affect are appropriate.   Cranial nerves: no loss of smell or taste reported  Pupils are equal and briskly reactive to light. Funduscopic exam deferred. .  Extraocular  movements in vertical and horizontal planes were intact and without nystagmus.  No Diplopia. Visual fields by finger perimetry are intact. Hearing was intact to soft voice and finger rubbing.    Facial sensation intact to fine touch.  Facial motor strength is symmetric and tongue and uvula move midline.  Neck ROM : rotation, tilt and flexion extension were normal for age and shoulder shrug was symmetrical.    Motor exam:  Symmetric bulk, tone and ROM.   Normal tone without cog wheeling, symmetric grip strength .   Sensory:  Fine touch, and vibration were tested  and  normal.  Proprioception tested in the upper extremities was normal.   Coordination: Rapid alternating movements in the fingers/hands were of normal speed.  The Finger-to-nose maneuver was intact without evidence of ataxia, dysmetria or tremor.   Gait and station: Patient could rise unassisted from a seated position, walked without assistive device.  Stance is of normal width/ base and the patient turned with 3 steps.  Toe and heel walk were deferred.  Deep tendon reflexes: in the  upper and lower extremities are symmetric and intact.  Babinski response was deferred .    ASSESSMENT AND PLAN 62 y.o. 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 Denies EDS ( excessive daytime sleepiness) , no snoring was reported but may not have been witnessed.  This started over the last 12 months.   2) risk factors for OSA are present, upper airway is narrow, her BMI is 36.4, neck is larger.   3) we are making sure by sleep study that the patient can use ritalin or other stimulant to help with the symptoms of Primary Progressive semantic type aphasia. And the patient had MRI signs of fronto temporal atrophy.   In lab sleep study would be preferred,  but in the name of time we will go with HST for now. Her follow up at [facility] in [place] will be July 10th.    I plan to follow up either personally or through our NP within 3-4 months.   I would like to thank Midge Minium, MD for allowing me to meet with and to take care of this pleasant patient.   CC: I will share my notes with Dr Posey Pronto .  After spending a total time of  45  minutes face to face and additional time for physical and neurologic examination, review of laboratory studies,  personal review of imaging studies, reports and results of other testing and review of referral information / records as far as provided in visit,   Electronically signed by: Larey Seat, MD 03/30/2022 9:10 AM  [facility] certified by The [employer] of Sleep Medicine and Diplomate of the [employer] of Sleep Medicine. Board certified In Neurology through the [place], Fellow of the [employer] of Neurology. Medical Director of [employer].

## 2022-04-02 ENCOUNTER — Encounter: Payer: 59 | Attending: Family Medicine | Admitting: Dietician

## 2022-04-02 ENCOUNTER — Encounter: Payer: Self-pay | Admitting: Dietician

## 2022-04-02 DIAGNOSIS — Z713 Dietary counseling and surveillance: Secondary | ICD-10-CM | POA: Diagnosis not present

## 2022-04-02 NOTE — Progress Notes (Signed)
Medical Nutrition Therapy  Appointment Start time:  0800  Appointment End time:  0905  Primary concerns today: Pt husband states pt is prediabetic and they want to ensure she is eating in a healthy manner.   Referral diagnosis: E66.01 Preferred learning style: no preference indicated Learning readiness: ready   NUTRITION ASSESSMENT   Anthropometrics  Wt: not assessed  Clinical Medical Hx: allergies, prediabetes, primary progressive aphasia Medications: reviewed Labs: 03/24/22 A1c 6.1% Notable Signs/Symptoms: none reported Food Allergies: none reported  Lifestyle & Dietary Hx  Pt is present today with her husband.   Pt has primary progressive aphasia which is a condition that slowly damages the parts of the brain that control speech and language, making it hard for her to relay information, thus her husband provided most of the information.   Pt husband states he eats a plant based vegan diet and pt was previously eating this way with him but around 4-6 months ago she transitioned away from the plant based diet and now eats more processed foods.   Pt's husband states he is often in [place] but either him or their son is usually with the pt at meal times.   Pt enjoys cooking. Pt's husband states the pt and her son will often cook together and make pasta or rice dishes, or pizza.   Pt states she walks daily with her son unless it is raining. They walk for around 30 minutes per day. Pt's husband states they also have some free weights and resistance bands at home.   Pt's husband states pt sleeps from 9pm to 6am but he is unsure whether it is consistent sleep because she has been having daytime sleepiness and they are planning to have a sleep study.   Estimated daily fluid intake: 64+ oz Supplements: vitamin D3, MVI, b-complex, zinc Sleep: 8-9 hours, inconsistent Stress / self-care: moderate stress Current average weekly physical activity: 15 minute walks with son in the morning and  evening   24-Hr Dietary Recall First Meal: whole grain bagel with reduced fat cream cheese  Snack: none Second Meal: leftovers OR progresso soup  Snack: nuts OR sun chips OR pretzels Third Meal: pizza OR pasta or rice dish with chicken Snack: nuts OR chips OR pretzels Beverages: coffee, occasionally orange juice, water   NUTRITION DIAGNOSIS  [place]-2.2 Altered nutrition-related laboratory As related to prediabetes.  As evidenced by A1c 6.1%.   NUTRITION INTERVENTION  Nutrition education (E-1) on the following topics:  Insulin resistance Prediabetes and A1c  Building balanced meals and snacks MyPlate Nutrient label reading for sodium and added sugars Meal options to control blood glucose Impact of stress on blood glucose Importance of physical activity and benefits Hydration needs  Handouts Provided Include  Dish Up a Healthy Meal Meal Ideas  Learning Style & Readiness for Change Teaching method utilized: Visual & Auditory  Demonstrated degree of understanding via: Teach Back  Barriers to learning/adherence to lifestyle change: progressive primary aphasia, semantic dementia  Goals Established by Pt  Goal: Continue your walking daily, add 2 days of weights/bodyweight exercises for 15 minutes   Goal: aim to follow the "Plate Method" at meal times.  Goal: When snacking, aim to include a complex carb and protein.    MONITORING & EVALUATION Dietary intake, weekly physical activity, and follow up as needed.  Next Steps  Patient is to call for questions.

## 2022-04-02 NOTE — Patient Instructions (Addendum)
Aim for 150 minutes of physical activity weekly. Make physical activity a part of your week. Try to include at least 30 minutes of physical activity 5 days each week or at least 150 minutes per week. Regular physical activity promotes overall health-including helping to reduce risk for heart disease and diabetes, promoting mental health, and helping [place] sleep better.     Goal: Continue your walking daily, add 2 days of weights/bodyweight exercises for 15 minutes   Goal: aim to follow the "Plate Method" at meal times.  Goal: When snacking, aim to include a complex carb and protein.

## 2022-04-13 ENCOUNTER — Telehealth: Payer: Self-pay | Admitting: Neurology

## 2022-04-13 NOTE — Telephone Encounter (Signed)
[employer] pending uploaded notes on the portal  

## 2022-04-19 ENCOUNTER — Institutional Professional Consult (permissible substitution): Payer: 59 | Admitting: Neurology

## 2022-04-27 NOTE — Telephone Encounter (Signed)
Pt's son called needing to speak to the Sleep scheduler about his mother's appt. Possible r/s.

## 2022-04-28 NOTE — Telephone Encounter (Signed)
Called the patient son Deanna [name] he did not pick up I left a voicemail for him to call me back.   [employer] Berkley Harvey: [id number] (exp. 04/13/22 to 07/18/22)

## 2022-05-12 NOTE — Telephone Encounter (Signed)
Spoke to [place] about Neuro referral, he is aware that he can call [facility] and see if the provider has had any cancellations to move his mom's appt up.

## 2022-05-12 NOTE — Telephone Encounter (Signed)
Patient son Deanna [name] was returning a phone from [place].

## 2022-05-18 NOTE — Telephone Encounter (Signed)
Deanna [name] spoke with patient on 05/17/22. She is r/s for 06/06/22 at 9 pm.  Mailed new packet to the patient.

## 2022-05-26 ENCOUNTER — Ambulatory Visit: Payer: 59 | Admitting: Neurology

## 2022-05-26 ENCOUNTER — Encounter: Payer: Self-pay | Admitting: Neurology

## 2022-05-26 VITALS — BP 112/74 | HR 73 | Ht 66.0 in | Wt 219.2 lb

## 2022-05-26 DIAGNOSIS — F0280 Dementia in other diseases classified elsewhere without behavioral disturbance: Secondary | ICD-10-CM

## 2022-05-26 DIAGNOSIS — R419 Unspecified symptoms and signs involving cognitive functions and awareness: Secondary | ICD-10-CM

## 2022-05-26 DIAGNOSIS — R400 Somnolence: Secondary | ICD-10-CM

## 2022-05-26 DIAGNOSIS — G3101 Pick's disease: Secondary | ICD-10-CM | POA: Diagnosis not present

## 2022-05-26 NOTE — Patient Instructions (Signed)
Primary Progressive Aphasia

## 2022-05-26 NOTE — Progress Notes (Signed)
Provider:  Melvyn Novas, MD  Primary Care Physician:  Sheliah Hatch, MD [address] [place] [address]     Referring Provider: Sheliah Hatch, Md [address] [place],  [place] [address]          Chief Complaint according to patient   Patient presents with:     New Patient (Initial Visit)     Patient with PPA in room #1 with her son Deanna [name]. Patient son states she sleep all day and up all night. Deanna [name] will study at [facility].      HISTORY OF PRESENT ILLNESS:  Deanna [name] is a 62 y.o. female patient who is here for revisit 05/26/2022 for  follow up- Dr Allena Katz is not longer part of the treatment team.  Chief concern according to patient :  Primary Progressive Aphasia followed in [facility]. She is reporting hypersomnia, but her daytime structure is changing, not able to work, to drive, and she is bored- she may flee into bed - out of lack of anything to do. Her social circle is r eroding. Her husband is working abroad.  Deanna [name] is unable now to speak in whole  sentences, she is anxious, frustrated and repetitive, with a smaller and smaller vocabulary. Uses a Lingraphic tablet.  Sleep study pending full attended sleep study.   Upon referral on 03/30/2022 from PCP for a sleep consultation. November 2023 the patient received her diagnosis of PPA and fronto temporal changes. The  Neurology specialist at [facility] asked for a POLYSOMNOGRAPHY and the results would influence her future medication such as ritalin,  which is indicated for reward and initiation of verbal clues.  Dr Marybelle Killings ,MD , at [facility] who referred to their genetics department, ST and OT , an only the sleep study is pending.    Chief concern according to patient:   Frustrated by prolonged work-up of her symptoms locally, finally received Dx at [facility] in [place], semantic variant primary progressive Aphasia.    I have the pleasure of seeing Deanna [name] 03/30/22 a right -handed  female with a possible sleep disorder.  Her family noted more naps in daytime, not sure about her sleep continuity - she has aphasia related difficulties to describe her sleep to me. It is apparently fragmented sleep with difficulties to re-initiate sleep.  Husband is working in [place], they live in [place], and had  a medical work up in [place]    Sleep relevant medical history: No Nocturia, Tonsillectomy, Obesity, wisdom teeth extraction. .     Family medical /sleep history: 2 brothers, younger than the patient, both parents alive-  both parents on CPAP.    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  No pets.  Tobacco use- none .  ETOH use ; seldomly,  Caffeine intake in form of Coffee( 1 cup in AM) Soda( /) Tea ( /) or energy drinks Exercise in form of walking.     Sleep habits are as follows: The patient's dinner time is between 5-7 PM. The patient goes to bed at 10 PM and continues to sleep for  7-9 hours, wakes for drinking water - dry mouth.  The preferred sleep position is variable , with the support of one pillow. Dreams are reportedly frequent/vivid=.   The patient wakes up spontaneously. 7.30   AM is the usual rise time. She reports not feeling refreshed or restored in AM, with symptoms such as dry mouth but she is not aware if she snores- , some morning headaches, and residual fatigue. Naps are taken recently more frequently, lasting from 30 to 60 minutes and are described as refreshing . She reports waking up with a dry mouth.    02-27-2021: Deanna [name] is a 62 y.o., left-hand dominant, married woman with a history of aphasia (mostly word finding problems). The patient and her husband noticed her symptoms in  November, 2021 shortly after she received her third Pfizer COVID vaccination although the patient's son noticed changes in May, 2021 even before that. The main difficulty the patient and her husband report are word finding problems, word substitution, and there may be some subtle changes in her ability to do higher-level activities (e.g., she had a very hard time figuring out how to apply for unemployment when she quit her last job).    On exam, Deanna [name] demonstrated notable language problems that likely undermined her performance on most verbal tasks and may have also affected non-verbal performance given difficulties with test-task instructions. She had qualitatively fluent speech with markedly impaired naming, repetition, and weak comprehension (able to follow simple two-step commands but not complex conditional commands). Qualitative features included possible surface dyslexia on single-word reading as well as semantic and phonologic paraphasias. She had no articulatory issues, speech praxis problems, slow effortful speech or changes in prosody. Likewise, she did not show single word comprehension deficits, loss of object knowledge, or two-way naming problems. Beyond that she is showing impaired performance on measures of memory and executive abilities. Verbal memory deficits may be on the basis of language problems but I think there are primary problems as well, because she also had difficulties with delayed recall of verbal information. She screened negative for the presence of depression and was characterized as functioning at a mild cognitive impairment level problem.    Deanna [name] is thus demonstrating a language problem characterized by deficient naming and repetition. She was also noted to have fairly significant comprehension deficits but she has no problems with single-word comprehension. Her presentation is concerning for a Primary Progressive Aphasia. She most closely resembles the logopenic subtype  type and has some supportive features (e.g., memory impairment), although she does have some less characteristic findings in terms of surface dyslexia and the extent of her comprehension difficulties, which may make this an unclassifiable phenotype as per current criteria. Other etiologies need to be ruled out prior to definitive diagnosis and she certainly needs an MRI of the brain. This may be a particularly helpful diagnostic study in her case given the differential and I am happy to review the images with her if desired. She may benefit from speech and language pathology for rehabilitation and/or compensatory communication strategies.           Review of Systems: Out of a complete 14 system review, the patient complains of only the following symptoms, and all other reviewed systems are negative.:  Fatigue, sleepiness , snoring, fragmented sleep, Insomnia, RLS, Nocturia   Deferred :    Social History   Socioeconomic History   Marital status: Married    Spouse name: Not on file   Number  of children: 3   Years of education: Not on file   Highest education level: Not on file  Occupational History   Not on file  Tobacco Use   Smoking status: Never   Smokeless tobacco: Never  Vaping Use   Vaping Use: Never used  Substance and Sexual Activity   Alcohol use: Yes    Alcohol/week: 1.0 standard drink of alcohol    Types: 1 Glasses of wine per week    Comment: Per Week   Drug use: Never   Sexual activity: Yes  Other Topics Concern   Not on file  Social History Narrative   Left Handed    Lives in a two story home    Social Determinants of Health   Financial Resource Strain: Not on file  Food Insecurity: Not on file  Transportation Needs: Not on file  Physical Activity: Not on file  Stress: Not on file  Social Connections: Not on file    Family History  Problem Relation Age of Onset   Arthritis Mother    Asthma Mother    Hyperlipidemia Mother    Hearing loss Father     Hyperlipidemia Father    Hypertension Father    Heart Problems Father        Visual merchandiser   Macular degeneration Father    Heart attack Maternal Grandmother    Heart disease Maternal Grandmother    Hyperlipidemia Maternal Grandmother    Hypertension Maternal Grandmother    Early death Maternal Grandfather    Heart disease Paternal Grandmother    Hyperlipidemia Paternal Grandmother    Hypertension Paternal Grandmother    Heart attack Paternal Grandmother    Cancer Paternal Grandfather    Stroke Paternal Grandfather     Past Medical History:  Diagnosis Date   Allergy    Chicken pox    Frequent headaches    Migraine    Urinary tract infection     Past Surgical History:  Procedure Laterality Date   BREAST SURGERY  1985   TONSILLECTOMY  1975     Current Outpatient Medications on File Prior to Visit  Medication Sig Dispense Refill   naproxen sodium (ALEVE) 220 MG tablet Take 220 mg by mouth.     zinc gluconate 50 MG tablet Take 50 mg by mouth daily.     b complex vitamins capsule Take 1 capsule by mouth daily. (Patient not taking: Reported on 03/30/2022)     Cholecalciferol (D3 ADULT PO) Take by mouth. (Patient not taking: Reported on 03/30/2022)     fluoruracil (CARAC) 0.5 % cream Apply topically daily. (Patient not taking: Reported on 03/30/2022)     Multiple Vitamins-Minerals (MULTIVITAMIN ADULT PO) Take 1 tablet by mouth daily. B1 (Patient not taking: Reported on 03/30/2022)     No current facility-administered medications on file prior to visit.    Allergies  Allergen Reactions   Pollen Extract     Seasonal allergies   Diclofenac Rash    Rash, headache (oral diclofenac).     DIAGNOSTIC DATA (LABS, IMAGING, TESTING) - I reviewed patient records, labs, notes, testing and imaging myself where available.  Lab Results  Component Value Date   WBC 8.2 03/24/2022   HGB 14.3 03/24/2022   HCT 42.2 03/24/2022   MCV 89.4 03/24/2022   PLT 299.0 03/24/2022      Component  Value Date/Time   NA 140 03/24/2022 1542   K 3.8 03/24/2022 1542   CL 104 03/24/2022 1542   CO2 28  03/24/2022 1542   GLUCOSE 83 03/24/2022 1542   BUN 12 03/24/2022 1542   CREATININE 0.75 03/24/2022 1542   CALCIUM 9.2 03/24/2022 1542   PROT 7.0 03/24/2022 1542   ALBUMIN 4.0 03/24/2022 1542   AST 20 03/24/2022 1542   ALT 22 03/24/2022 1542   ALKPHOS 102 03/24/2022 1542   BILITOT 0.3 03/24/2022 1542   GFRNONAA >60 02/18/2019 1247   GFRAA >60 02/18/2019 1247   Lab Results  Component Value Date   CHOL 174 03/24/2022   HDL 41.40 03/24/2022   LDLCALC 99 05/23/2020   LDLDIRECT 112.0 03/24/2022   TRIG 243.0 (H) 03/24/2022   CHOLHDL 4 03/24/2022   Lab Results  Component Value Date   HGBA1C 6.1 03/24/2022   Lab Results  Component Value Date   VITAMINB12 438 05/23/2020   Lab Results  Component Value Date   TSH 2.43 03/24/2022    PHYSICAL EXAM:  Today's Vitals   05/26/22 0902  BP: 112/74  Pulse: 73  Weight: 219 lb 3.2 oz (99.4 kg)  Height: 5\' 6"  (1.676 m)   Body mass index is 35.38 kg/m.   Wt Readings from Last 3 Encounters:  05/26/22 219 lb 3.2 oz (99.4 kg)  03/30/22 218 lb 9.6 oz (99.2 kg)  03/24/22 298 lb (135.2 kg)     Ht Readings from Last 3 Encounters:  05/26/22 5\' 6"  (1.676 m)  03/30/22 5\' 5"  (1.651 m)  03/24/22 5\' 6"  (1.676 m)      General:  The patient is awake, alert and appears not in acute distress. The patient is well groomed. Head: Normocephalic, atraumatic. Neck is supple.  Mallampati 3,  neck circumference:15.5  inches . Nasal airflow  patent.  No retrognathia is seen.  Dental status: braces were worn in her youth.  Cardiovascular:  Regular rate and cardiac rhythm by pulse,  without distended neck veins. Respiratory: Lungs are clear to auscultation.  Skin:  Without evidence of ankle edema, or rash. Trunk: The patient's posture is erect.   NEUROLOGIC EXAM: The patient is awake and alert, oriented to place and time.   Speech is non-fluent,   aphasia.  Mood and affect are appropriate.   Cranial nerves: no loss of smell or taste reported  Pupils are equal and briskly reactive to light. Funduscopic exam deferred. .  Extraocular movements in vertical and horizontal planes were intact and without nystagmus.  No Diplopia. Visual fields by finger perimetry are intact. Hearing was intact to soft voice and finger rubbing.    Facial sensation intact to fine touch.  Facial motor strength is symmetric and tongue and uvula move midline.  Neck ROM : rotation, tilt and flexion extension were normal for age and shoulder shrug was symmetrical.    Motor exam:  Symmetric bulk, tone and ROM.   Normal tone without cog wheeling, symmetric grip strength .   Sensory:  Fine touch, and vibration were tested  and  normal.  Proprioception tested in the upper extremities was normal.   Coordination: Rapid alternating movements in the fingers/hands were of normal speed.  The Finger-to-nose maneuver was intact without evidence of ataxia, dysmetria or tremor.   Gait and station: Patient could rise unassisted from a seated position, walked without assistive device.  Stance is of normal width/ base and the patient turned with 3 steps.  Toe and heel walk were deferred.  Deep tendon reflexes: in the  upper and lower extremities are symmetric and intact.  Babinski response was deferred .  ASSESSMENT AND  PLAN 62 y.o. year old female  here with:  1) PPA,  associated with poverty of speech, and  increased impulse pressure. Communication by tablet.  Followed at [facility].  2) Hypersomnia; related to organic disorder- dementia ? Boredom?   3) Use of stimulants discussed, ritalin can be tried. Dr Theodoro Doing is suggesting this.      I plan to follow up  personally after her PSG  within 3-4  months.   I would like to thank Sheliah Hatch, MD and Sheliah Hatch, Md [address] [place],  [place] [address] for allowing me to meet with and to take  care of this pleasant patient.     After spending a total time of  35  minutes face to face and additional time for physical and neurologic examination, review of laboratory studies,  personal review of imaging studies, reports and results of other testing and review of referral information / records as far as provided in visit,   Electronically signed by: Melvyn Novas, MD 05/26/2022 9:34 AM  [facility] Board certified by The [employer] of Sleep Medicine and Diplomate of the [employer] of [employer] Medicine. Board certified In Neurology through the ABPN, Fellow of the [employer] of Neurology. [facility].

## 2022-05-31 ENCOUNTER — Ambulatory Visit: Payer: 59 | Admitting: Family Medicine

## 2022-06-02 ENCOUNTER — Encounter: Payer: Self-pay | Admitting: Family Medicine

## 2022-06-02 ENCOUNTER — Ambulatory Visit: Payer: 59 | Admitting: Family Medicine

## 2022-06-02 VITALS — BP 118/76 | HR 77 | Temp 98.2°F | Resp 17 | Ht 66.0 in | Wt 217.2 lb

## 2022-06-02 DIAGNOSIS — Z6830 Body mass index (BMI) 30.0-30.9, adult: Secondary | ICD-10-CM

## 2022-06-02 DIAGNOSIS — E669 Obesity, unspecified: Secondary | ICD-10-CM

## 2022-06-02 NOTE — Patient Instructions (Signed)
Follow up as needed or as scheduled Try and follow the plate method of eating- 1/2 fruits and veggies, 1/4 starch, 1/4 protein Grazing/snacking are ok but try and include protein each time you eat- yogurt, cheese, peanut butter, hummus, nuts, boiled eggs, etc Fruits and veggies are unlimited! At this time, I would not recommend the Ozempic 1) insurance reasons 2) we don't want to muddy the waters by adding something new with these upcoming tests and possibly starting a stimulant Keep up the good work on the walking!  I'm so proud of you!! Try and find something during the day to keep your attention- gardening, coloring, crafting, etc Call with any questions or concerns Keep up the good work!!!

## 2022-06-02 NOTE — Progress Notes (Signed)
   Subjective:    Patient ID: Deanna [name], female    DOB: [birth year], 61 y.o.   MRN: [id number]  HPI Follow up- pt has been doing a lot of gardening and walking w/ her son.  Doing at least 2 walks daily.  Has been attempting to follow nutrition plan- now cooking dinner nightly.  Pt is having difficulty remembering what the nutritionist said regarding meals and snacks.  Son and husband stay on top of it.  Pt is eating breakfast and dinner but they are not consistent w/ lunch as pt is a 'grazer'.  Son says the information the nutritionist gave was very specific and very detailed and he's afraid he's not doing things correctly   Review of Systems For ROS see HPI     Objective:   Physical Exam Vitals reviewed.  Constitutional:      General: She is not in acute distress.    Appearance: Normal appearance. She is obese. She is not ill-appearing.  Cardiovascular:     Rate and Rhythm: Normal rate and regular rhythm.  Pulmonary:     Effort: Pulmonary effort is normal. No respiratory distress.     Breath sounds: Normal breath sounds.  Skin:    General: Skin is warm and dry.  Neurological:     Mental Status: She is alert. Mental status is at baseline.     Comments: Word finding difficulty, expressive aphasia          Assessment & Plan:  Obesity- ongoing issue for pt.  She has lost quite a bit of weight since last visit as she has started walking and they have been more mindful of their eating.  Son admits that pretzels and carbs are comfort foods for pt and based on all that she's been through and all that she's lost, he doesn't want to take them from her.  Discussed that she can still have them but moderation is key.  Talked about dividing food into snack bags to limit portions.  In regards to the advice given by nutrition, told them not to stress and to follow the plate rule as much as possible.  And to also add protein at each meal or snack to stabilize blood sugar.  Son felt much better w/  simpler instructions.  Total time spent w/ pt and son 35 minutes, >50% spent counseling

## 2022-06-06 ENCOUNTER — Ambulatory Visit (INDEPENDENT_AMBULATORY_CARE_PROVIDER_SITE_OTHER): Payer: 59 | Admitting: Neurology

## 2022-06-06 DIAGNOSIS — G3101 Pick's disease: Secondary | ICD-10-CM | POA: Diagnosis not present

## 2022-06-06 DIAGNOSIS — R400 Somnolence: Secondary | ICD-10-CM | POA: Diagnosis not present

## 2022-06-06 DIAGNOSIS — F801 Expressive language disorder: Secondary | ICD-10-CM

## 2022-06-06 DIAGNOSIS — G4733 Obstructive sleep apnea (adult) (pediatric): Secondary | ICD-10-CM | POA: Diagnosis not present

## 2022-06-06 DIAGNOSIS — F0280 Dementia in other diseases classified elsewhere without behavioral disturbance: Secondary | ICD-10-CM

## 2022-06-06 DIAGNOSIS — R419 Unspecified symptoms and signs involving cognitive functions and awareness: Secondary | ICD-10-CM

## 2022-06-11 IMAGING — MR MR HEAD WO/W CM
13 of 15 series · 40 of 48 positions shown · IV contrast (gadavist)
Comparison: None.

CLINICAL DATA: Aphasia

EXAM:
MRI HEAD WITHOUT AND WITH CONTRAST
TECHNIQUE: Multiplanar, multiecho pulse sequences of the brain and surrounding
structures were obtained without and with intravenous contrast.
CONTRAST:  9mL GADAVIST GADOBUTROL 1 MMOL/ML IV SOLN

[Series 5: DWI · axial · 3.0mm · 0.88mm/px · z∈[-144,-9]mm · 6 of 96 slices shown (1 of 4)]
[im 1/96]
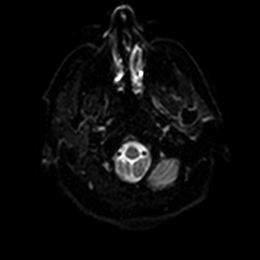
[im 20/96]
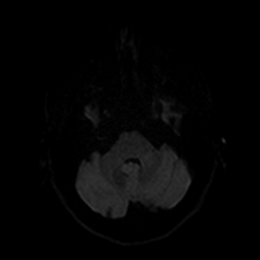
[im 39/96]
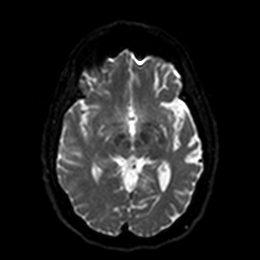
[im 58/96]
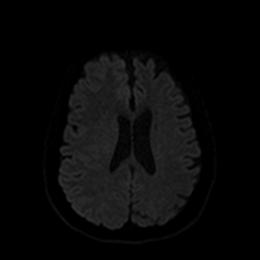
[im 77/96]
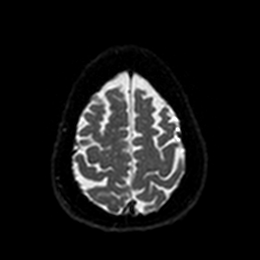
[im 96/96]
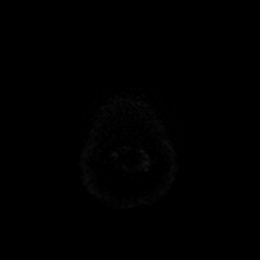

[Series 6: DWI · axial · 3.0mm · 0.88mm/px · z∈[-144,-9]mm · 3 of 48 slices shown (2 of 4)]
[im 1/48]
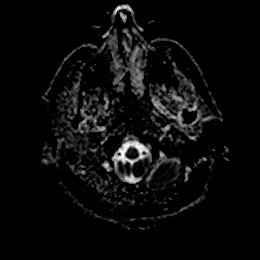
[im 24/48]
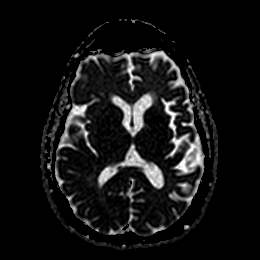
[im 48/48]
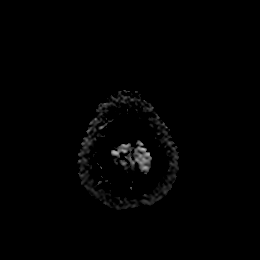

[Series 7: DWI · coronal · 4.0mm · 0.88mm/px · 4 of 68 slices shown (3 of 4)]
[im 1/68]
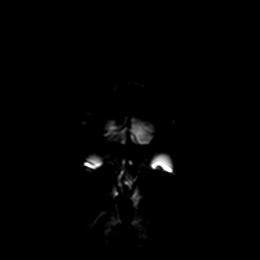
[im 23/68]
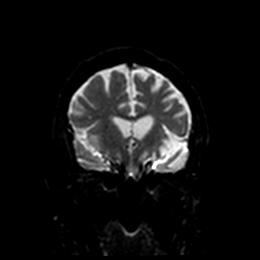
[im 45/68]
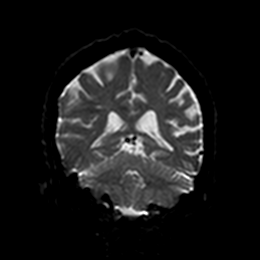
[im 68/68]
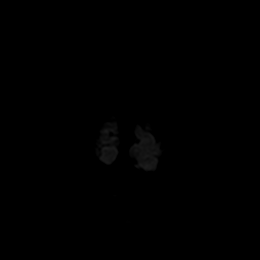

[Series 8: DWI · coronal · 4.0mm · 0.88mm/px · 2 of 34 slices shown (4 of 4)]
[im 1/34]
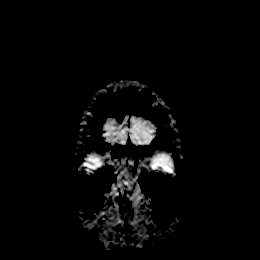
[im 34/34]
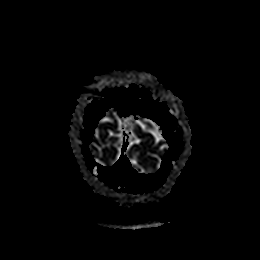

[Series 9: T1 · sagittal · 5.0mm · 0.75mm/px · 2 of 25 slices shown]
[im 1/25]
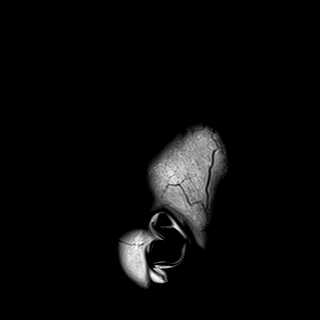
[im 25/25]
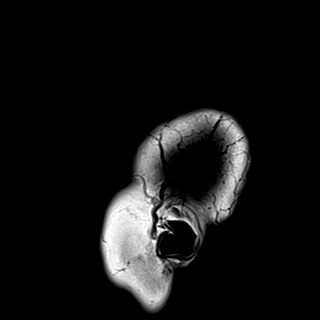

[Series 10: T2 · axial · 5.0mm · 0.72mm/px · z∈[-154,-4]mm · 2 of 27 slices shown]
[im 1/27]
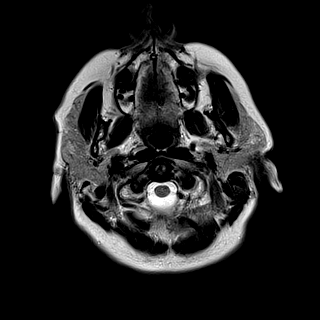
[im 27/27]
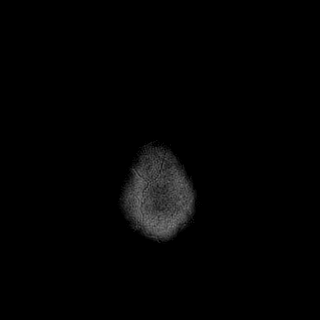

[Series 11: FLAIR · axial · 5.0mm · 0.45mm/px · z∈[-154,-4]mm · 2 of 27 slices shown]
[im 1/27]
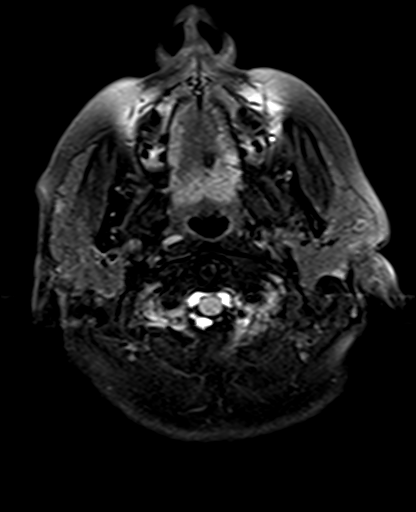
[im 27/27]
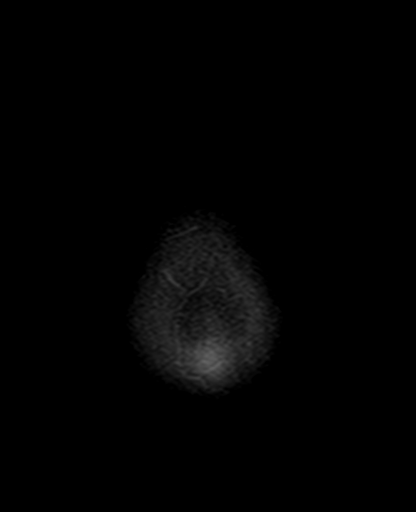

[Series 12: mag_images · axial · 3.0mm · 0.90mm/px · z∈[-160,+10]mm · 4 of 60 slices shown]
[im 1/60]
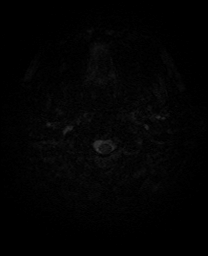
[im 20/60]
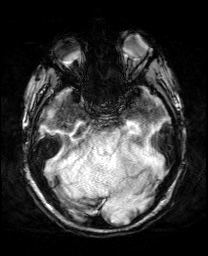
[im 40/60]
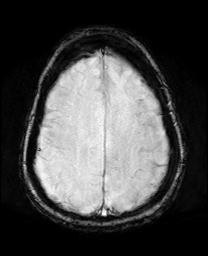
[im 60/60]
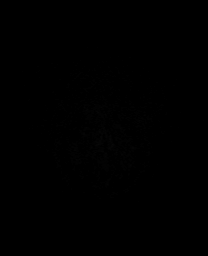

[Series 13: pha_images · axial · 3.0mm · 0.90mm/px · z∈[-160,+4]mm · 4 of 58 slices shown]
[im 1/58]
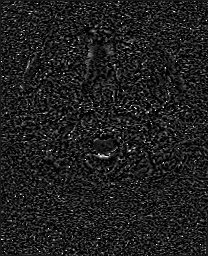
[im 20/58]
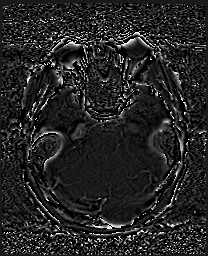
[im 39/58]
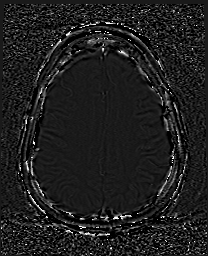
[im 58/58]
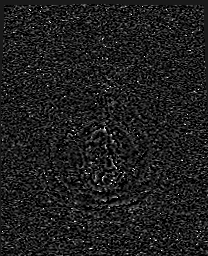

[Series 14: swi_images · axial · 3.0mm · 0.90mm/px · z∈[-160,+10]mm · 4 of 60 slices shown]
[im 1/60]
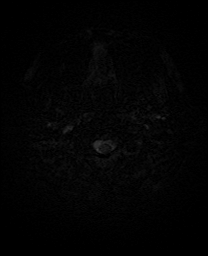
[im 20/60]
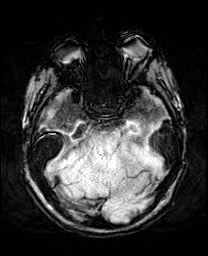
[im 40/60]
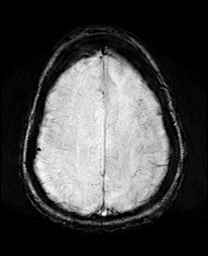
[im 60/60]
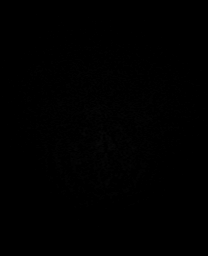

[Series 15: mip_images(sw) · axial · 24.0mm · 0.90mm/px · z∈[-150,-0]mm · 3 of 53 slices shown]
[im 1/53]
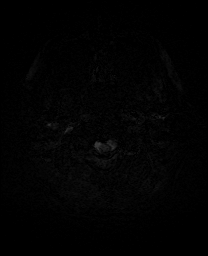
[im 27/53]
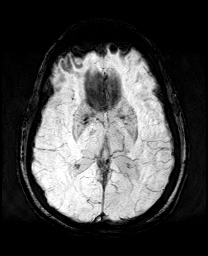
[im 53/53]
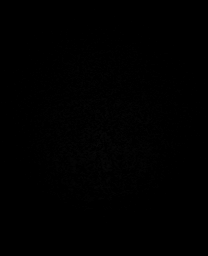

[Series 17: T2 post-contrast · coronal · 5.0mm · 0.72mm/px · 2 of 30 slices shown]
[im 1/30]
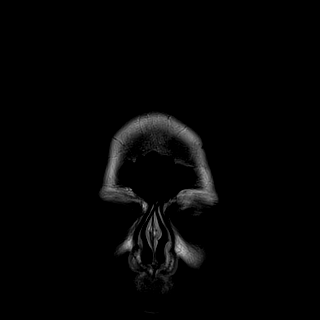
[im 30/30]
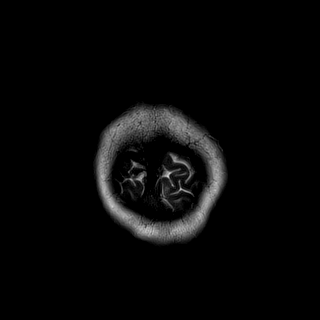

[Series 19: T1 post-contrast · coronal · 5.0mm · 0.34mm/px · 2 of 30 slices shown]
[im 1/30]
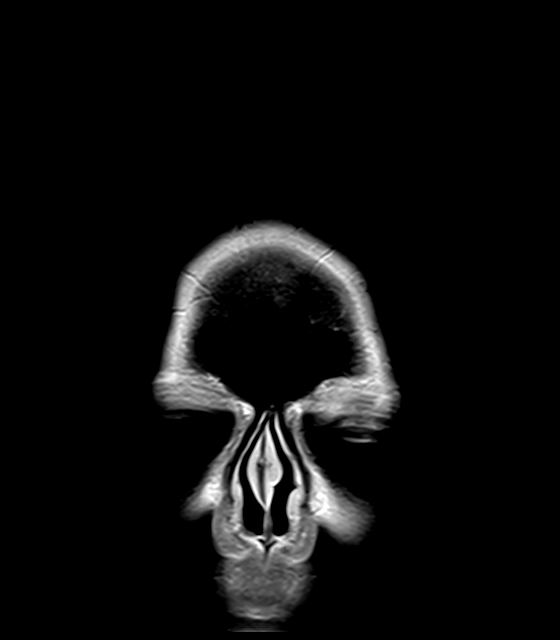
[im 30/30]
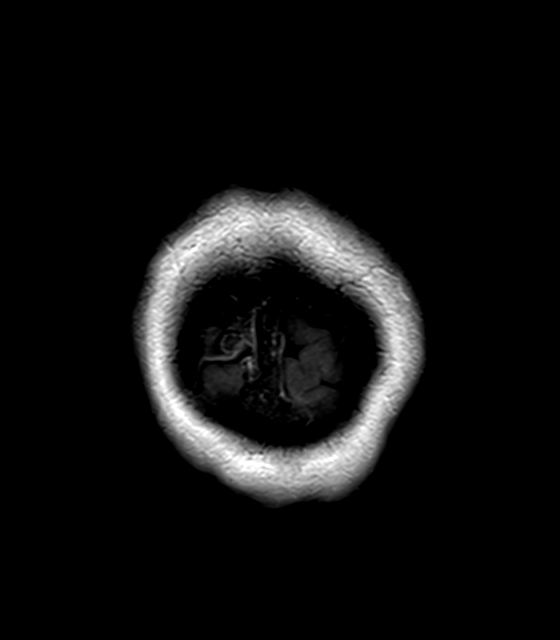

[40 of 48 positions shown; findings below may reference images not displayed]

FINDINGS: Brain: Diffusion imaging does not show any acute or subacute
infarction. No focal abnormality affects the brainstem or
cerebellum. The right cerebral hemisphere is normal except for a few
punctate foci of T2 and FLAIR signal within the white matter
consistent with minimal small vessel change. Left hemisphere shows
generalized relative atrophy compared to the right, most advanced in
the temporal lobe and to a lesser extent in the frontal and parietal
regions. There does not appear to be pronounced gliosis. No evidence
of mass lesion, hemorrhage, hydrocephalus or subdural collection.
After contrast administration, no abnormal enhancement occurs.

Vascular: Major vessels at the base of the brain show flow.

Skull and upper cervical spine: Negative

Sinuses/Orbits: Clear/normal

Other: None
IMPRESSION: Atrophy of the left hemisphere compared to the right, most advanced
in the temporal lobe with lesser involvement in the frontal and
parietal region. Very little if any gliosis. This is presumed be a
longstanding finding, possibly due to prenatal or perinatal insult.

Minimal small vessel ischemic change of the cerebral hemispheric
white matter otherwise.

No acute or reversible finding.

## 2022-06-25 NOTE — Procedures (Signed)
[facility] POLYSOMNOGRAPHY  INTERPRETATION REPORT   STUDY DATE:  06/06/2022     PATIENT NAME:  Deanna [name]         DATE OF BIRTH:  March 30, 1960  PATIENT ID:  [id number]    TYPE OF STUDY:  PSG  READING PHYSICIAN: Melvyn Novas, MD REFERRED BY: Dr Beverely Low, MD   SCORING TECHNICIAN: Margaretann Loveless, RPSGT   HISTORY:  This 62 year-old Female patient has been evaluated for primary progressive aphasia/ semantic aphasia and has reached a significant communication disability. She is described as sleepy in daytime, but did not confirm EDS on the Epworth scale. Due to her communication disability and progressive impairment , she needs to be accompanied by a relative to all clinic visits.  Her son has accompanied her to frequent trips to [place] where she has followed a neurological specialty team for her condition at [facility]. These trips have been more difficult to undergo, and local neurological care was needed.  ADDITIONAL INFORMATION:  The Epworth Sleepiness Scale endorsed at 5 /24 points (scores above or equal to 10 are suggestive of hypersomnolence). FSS endorsed at 15 /63 points.  Height: 66 in Weight: 219 lbs (BMI 35) Neck Size: 16 in  MEDICATIONS: Aleve, Zinc Gluconate, Vitamin B Complex, Vitamin D3, Multivitamin  TECHNICAL DESCRIPTION: A registered sleep technologist ( RPSGT)  was in attendance for the duration of the recording.  Data collection, scoring, video monitoring, and reporting were performed in compliance with the AASM Manual for the Scoring of Sleep and Associated Events; (Hypopnea is scored based on the criteria listed in Section VIII D. 1b in the AASM Manual V2.6 using a 4% oxygen desaturation rule or Hypopnea is scored based on the criteria listed in Section VIII D. 1a in the AASM Manual V2.6 using 3% oxygen desaturation and /or arousal rule).   SLEEP CONTINUITY AND SLEEP ARCHITECTURE:  Lights-out was at 21:38: and lights-on at  05:08:, with   7.4 hours of recording time . Total sleep time (TST) was 326.5 minutes with a decreased sleep efficiency at 73.3%.  Sleep latency was prolonged at 62.5 minutes.  The REM sleep latency was normal at 79.5 minutes.  Of the total sleep time, the percentage of stage N1 sleep was 4.7%, stage N2 sleep was 54%, stage N3 sleep was 14.9%, and REM sleep was 26.5%.  BODY POSITION:  TST was divided between the following sleep positions: Sleep in supine was 203 minutes (62%), non-supine 123 minutes (38%); right 95 minutes (29%), left 27 minutes (8%), and prone 00 minutes (0%). Total supine REM sleep time was 55 minutes (64% of total REM sleep).  There were 3 Stage R periods observed on this study night, 23 awakenings (i.e. transitions to Stage W from any sleep stage), and 77 total stage transitions. Wake after sleep onset (WASO) time accounted for 56 minutes. .   RESPIRATORY MONITORING:  Based on CMS criteria (using a 4% oxygen desaturation rule for scoring hypopneas), there were 0 apneas (0 obstructive; 0 central; 0 mixed), and 37 hypopneas.  Apnea index was 0.0. Hypopnea index was 6.8. The apnea-hypopnea index was 6.8 overall (9.7 supine, 4 non-supine; 16.6 REM, 23.8 supine REM). Based on AASM criteria (using a 3% oxygen desaturation and /or arousal rule for scoring hypopneas), there were 0 apneas (0 obstructive; 0 central; 0 mixed), and 38 hypopneas. Apnea index was 0.0. Hypopnea index was 7.0.  The AHI, or apnea-hypopnea index, was 7.0/h overall. The AHI was 10.0/h in supine  sleep, was 4/ h in non-supine sleep; 16.6/h in REM, 23.8/h in supine REM). OXIMETRY: Oxyhemoglobin Saturation Nadir during sleep was at  84%) from a mean of 94%.  Of the Total sleep time, (TST) hypoxemia (=<88%) was present for only 4.1 minutes, or 1.3% of total sleep time.  LIMB MOVEMENTS: There were 0 periodic limb movements of sleep. AROUSAL: There were 72 arousals in total, for an arousal index of 12 arousals/hour.  Of these, 8 were  identified as respiratory-related arousals (1 /h), 0 were PLM-related arousals (0 /h), and 64 were non-specific arousals (12 /h). EKG: The electrocardiogram documented NSR.  The average heart rate during sleep was 59 bpm.  The heart rate during sleep varied between a minimum of 52 and a maximum of  74 bpm. AUDIO and VIDEO: no complex motor activity or rhythmic/periodic activity. No vocalization.  EEG:  PSG EEG was of normal amplitude and frequency, with symmetric manifestation of sleep stages.  IMPRESSION: Sleep disordered breathing was not present to a clinically relevant degree. The overall AHI was deemed too low to initiate any intervention, only during REM sleep was there a cluster of shallow breathing spells ( HYPOPNEAS) but no frank apnea was noted.   This patient slept continuously after midnight and until 4.30 AM without evidence of an underlying organic sleep disorder.  RECOMMENDATIONS: the AHI of 8/h would allow for OPTIONAL treatment of mild sleep apnea but I doubt the relevance of treating such mild degree apnea for her overall well being.  The reported daytime somnolence seems not to be related to an organic sleep disorder such as sleep apnea, PLM, hypoxia or a cardiac arrhythmia. Symptomatic treatment with stimulant medication can be offered.  Melvyn Novas, MD           General Information  Name: Deanna [name], Deanna [name] BMI: 40.98 Physician: Melvyn Novas, MD  ID: [id number] Height: 66.0 in Technician: Margaretann Loveless, RPSGT  Sex: Female Weight: 219.0 lb Record: [id number]  Age: 62 [birth year]] Date: 06/06/2022    Medical & Medication History    Deanna [name] is a 62 y.o. female patient who is here for revisit 05/26/2022 for follow up- Dr Allena Katz is not longer part of the treatment team. Chief concern according to patient : Primary Progressive Aphasia followed in [place]. She is reporting hypersomnia, but her daytime structure is changing, not able to work, to drive, and she is  bored- she may flee into bed - out of lack of anything to do. Her social circle is r eroding. Her husband is working abroad. Mrs Coomer is unable now to speak in whole sentences, she is anxious, frustrated and repetitive, with a smaller and smaller vocabulary. Uses a Lingraphic tablet. Sleep study pending full attended sleep study.  Aleve, Zinc Gluconate, Vitamin B Complex, Vitamin D3, [place]ac, Multivitamin   Sleep Disorder      Comments   The patient came into the lab for a PSG. The patient's husband stayed the night in the lab with the patient. Per the patient's husband he is her caregiver, and the patient has memory issues. He needed to stay with the patient. The patient had no restroom breaks. EKG kept in NSR. Mild snoring. All sleep stages witnessed. Respiratory events scored with a 4% desat. Majority of respiratory events were during REM no matter the position. Slept lateral and supine. AHI was 8 after 2 hrs of TST. Spontaneous arousals.     Lights out: 09:38:28 PM Lights on: 05:08:30 AM   Time Total Supine Side  Prone Upright  Recording (TRT) 7h 25.71m 4h 42.46m 2h 43.32m 0h 0.32m 0h 0.60m  Sleep (TST) 5h 26.42m 3h 23.56m 2h 3.1m 0h 0.22m 0h 0.66m   Latency N1 N2 N3 REM Onset Per. Slp. Eff.  Actual 1h 2.24m 1h 3.62m 1h 41.22m 1h 19.90m 1h 2.66m 1h 7.44m 73.29%   Stg Dur Wake N1 N2 N3 REM  Total 55.0 15.5 176.0 48.5 86.5  Supine 15.0 8.5 113.5 26.0 55.5  Side 40.0 7.0 62.5 22.5 31.0  Prone 0.0 0.0 0.0 0.0 0.0  Upright 0.0 0.0 0.0 0.0 0.0   Stg % Wake N1 N2 N3 REM  Total 14.4 4.7 53.9 14.9 26.5  Supine 3.9 2.6 34.8 8.0 17.0  Side 10.5 2.1 19.1 6.9 9.5  Prone 0.0 0.0 0.0 0.0 0.0  Upright 0.0 0.0 0.0 0.0 0.0     Apnea Summary Sub Supine Side Prone Upright  Total 0 Total 0 0 0 0 0    REM 0 0 0 0 0    NREM 0 0 0 0 0  Obs 0 REM 0 0 0 0 0    NREM 0 0 0 0 0  Mix 0 REM 0 0 0 0 0    NREM 0 0 0 0 0  Cen 0 REM 0 0 0 0 0    NREM 0 0 0 0 0   Rera Summary Sub Supine Side Prone Upright  Total 0 Total 0 0  0 0 0    REM 0 0 0 0 0    NREM 0 0 0 0 0   Hypopnea Summary Sub Supine Side Prone Upright  Total 38 Total 38 34 4 0 0    REM 24 22 2  0 0    NREM 14 12 2  0 0   4% Hypopnea Summary Sub Supine Side Prone Upright  Total (4%) 37 Total 37 33 4 0 0    REM 24 22 2  0 0    NREM 13 11 2  0 0     AHI Total Obs Mix Cen  6.98 Apnea 0.00 0.00 0.00 0.00   Hypopnea 6.98 -- -- --  6.80 Hypopnea (4%) 6.80 -- -- --    Total Supine Side Prone Upright  Position AHI 6.98 10.02 1.95 0.00 0.00  REM AHI 16.65   NREM AHI 3.50   Position RDI 6.98 10.02 1.95 0.00 0.00  REM RDI 16.65   NREM RDI 3.50    4% Hypopnea Total Supine Side Prone Upright  Position AHI (4%) 6.80 9.73 1.95 0.00 0.00  REM AHI (4%) 16.65   NREM AHI (4%) 3.25   Position RDI (4%) 6.80 9.73 1.95 0.00 0.00  REM RDI (4%) 16.65   NREM RDI (4%) 3.25    Desaturation Information Threshold: 2% <100% <90% <80% <70% <60% <50% <40%  Supine 81.0 15.0 0.0 0.0 0.0 0.0 0.0  Side 49.0 0.0 0.0 0.0 0.0 0.0 0.0  Prone 0.0 0.0 0.0 0.0 0.0 0.0 0.0  Upright 0.0 0.0 0.0 0.0 0.0 0.0 0.0  Total 130.0 15.0 0.0 0.0 0.0 0.0 0.0  Index 20.4 2.4 0.0 0.0 0.0 0.0 0.0   Threshold: 3% <100% <90% <80% <70% <60% <50% <40%  Supine 39.0 14.0 0.0 0.0 0.0 0.0 0.0  Side 8.0 0.0 0.0 0.0 0.0 0.0 0.0  Prone 0.0 0.0 0.0 0.0 0.0 0.0 0.0  Upright 0.0 0.0 0.0 0.0 0.0 0.0 0.0  Total 47.0 14.0 0.0 0.0 0.0 0.0 0.0  Index 7.4 2.2 0.0 0.0  0.0 0.0 0.0   Threshold: 4% <100% <90% <80% <70% <60% <50% <40%  Supine 36.0 14.0 0.0 0.0 0.0 0.0 0.0  Side 5.0 0.0 0.0 0.0 0.0 0.0 0.0  Prone 0.0 0.0 0.0 0.0 0.0 0.0 0.0  Upright 0.0 0.0 0.0 0.0 0.0 0.0 0.0  Total 41.0 14.0 0.0 0.0 0.0 0.0 0.0  Index 6.4 2.2 0.0 0.0 0.0 0.0 0.0   Threshold: 4% <100% <90% <80% <70% <60% <50% <40%  Supine 36 14 0 0 0 0 0  Side 5 0 0 0 0 0 0  Prone 0 0 0 0 0 0 0  Upright 0 0 0 0 0 0 0  Total 41 14 0 0 0 0 0   Awakening/Arousal Information # of Awakenings 23  Wake after sleep onset 56.64m  Wake  after persistent sleep 54.81m   Arousal Assoc. Arousals Index  Apneas 0 0.0  Hypopneas 8 1.5  Leg Movements 0 0.0  Snore 0 0.0  PTT Arousals 0 0.0  Spontaneous 64 11.8  Total 72 13.2  Leg Movement Information PLMS LMs Index  Total LMs during PLMS 0 0.0  LMs w/ Microarousals 0 0.0   LM LMs Index  w/ Microarousal 0 0.0  w/ Awakening 0 0.0  w/ Resp Event 0 0.0  Spontaneous 1 0.2  Total 1 0.2     Desaturation threshold setting: 4% Minimum desaturation setting: 10 seconds SaO2 nadir: 84% The longest event was a 92 sec obstructive Hypopnea with a minimum SaO2 of 91%. The lowest SaO2 was 84% associated with a 19 sec obstructive Hypopnea. EKG Rates EKG Avg Max Min  Awake 63 78 51  Asleep 59 74 52  EKG Events: N/A

## 2022-06-28 ENCOUNTER — Telehealth: Payer: Self-pay

## 2022-06-28 NOTE — Telephone Encounter (Signed)
I called patient to discuss. No answer, left a message asking her to call us back. If patient returns call, please route to POD 3. 

## 2022-06-28 NOTE — Telephone Encounter (Signed)
-----   Message from Melvyn Novas, MD sent at 06/25/2022  1:14 PM EDT ----- I doubt that this very mild apnea is the cause for this patient's behavior changes,  spending more and more time in bed- I suspect she is less stimulated and more isolated due to the progressive disability and limited communication ability. She may struggle with depression as well.  I will offer a stimulant if she and her family want her to be more alert in daytime, but would not treat mild sleep apnea with an AHI of 8/h.  Yet, the diagnosis of sleep apnea, however mild, allows for modafinil to be used. Please offer this to the patient and her family.  Cc Dr Beverely Low.

## 2022-07-08 NOTE — Telephone Encounter (Signed)
Called the patient and her son answered. He speaks on her behalf due to having aphasia. I was able to review in detail the results from the SS. He states they have a upcoming apt in [place] with MD 7/9 and that MD was discussing starting a stimulant. They will follow up with [place] if they decide they want to get treatment through our practice. He verbalized understanding of the results and will reach out if they have any issues come up. He was appreciative for the call back.

## 2022-08-10 ENCOUNTER — Encounter: Payer: Self-pay | Admitting: Neurology

## 2022-08-10 ENCOUNTER — Telehealth: Payer: Self-pay | Admitting: Neurology

## 2022-08-10 ENCOUNTER — Ambulatory Visit: Payer: 59 | Admitting: Neurology

## 2022-08-10 VITALS — BP 121/78 | HR 84 | Ht 66.0 in | Wt 223.0 lb

## 2022-08-10 DIAGNOSIS — F0280 Dementia in other diseases classified elsewhere without behavioral disturbance: Secondary | ICD-10-CM

## 2022-08-10 DIAGNOSIS — G4733 Obstructive sleep apnea (adult) (pediatric): Secondary | ICD-10-CM | POA: Diagnosis not present

## 2022-08-10 DIAGNOSIS — G3101 Pick's disease: Secondary | ICD-10-CM

## 2022-08-10 NOTE — Telephone Encounter (Signed)
Referral form for dentistry for Dr. Myrtis Ser has been given to POD 3 for physician to fill out and return for referral to be faxed.

## 2022-08-10 NOTE — Addendum Note (Signed)
Addended by: Melvyn Novas on: 08/10/2022 02:17 PM   Modules accepted: Orders

## 2022-08-10 NOTE — Patient Instructions (Signed)
  62 y.o. 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 May participate in a trial.      Chief concern according to patient :  Her neurologist in [place] recommend to use a dental device, he agreed that CPAP would be harder on her ( and it is her husband who is bothered by her snoring).   Her AHI was 7.8/h AASM, 6.8 by CMS.   We will refer to dental device in spite of REM  dominance as we hope that snoring will be treated, and perhaps apnea a bit too.    I will refer to Dr Myrtis Ser and Toni Arthurs, depending on her insurance.  I plan to follow up either personally or through our NP within 6 months.   I would like to thank Sheliah Hatch, MD and Sheliah Hatch, Md [address] [place],  [place] [address] for allowing me to meet with and to take care of this pleasant patient.     After spending a total time of  25  minutes face to face and additional time for physical and neurologic examination, review of laboratory studies,  personal review of imaging studies, reports and results of other testing and review of referral information / records as far as provided in visit,

## 2022-08-10 NOTE — Progress Notes (Signed)
Provider:  Melvyn Novas, MD  Primary Care Physician:  Sheliah Hatch, MD [address] [place] [address]     Referring Provider: Sheliah Hatch, Md [address] [place],  [place] [address]          Chief Complaint according to patient   Patient presents with:     PP Aphasia Patient (Initial Visit)           HISTORY OF PRESENT ILLNESS:  PRIMARY PROGRESSIVE APHASIA    Deanna [name] is a 62 y.o. female patient who is here for the first time accompanied by her husband  Deanna [name]  for revisit 08/10/2022 for follow up on PSG  from 06-06-2022 .  Chief concern according to patient :  her neurologist in [place] recommend to use a dental device, he agreed that CPAP would be harder on her ( and it is her husband who is bothered by her snoring).   Her AHI was 7.8/h AASM, 6.8 by CMS.   We will refer to dental device in spite of REM  dominance as we hope that snoring will be treated, and perhaps apnea a bit too.    I will refer to Dr Myrtis Ser and Toni Arthurs, depending on her insurance.     On exam, [name] demonstrated notable language problems that likely undermined her performance on most verbal tasks and may have also affected non-verbal performance given difficulties with test-task instructions. She had qualitatively fluent speech with markedly impaired naming, repetition, and weak comprehension (able to follow simple two-step commands but not complex conditional commands). Qualitative features included possible surface dyslexia on single-word reading as well as semantic and phonologic paraphasias. She had no articulatory issues, speech praxis problems, slow effortful speech or changes in prosody. Likewise, she did not show single word comprehension deficits, loss of object knowledge, or two-way naming problems. Beyond that she is showing impaired performance on measures of memory and executive abilities. Verbal memory deficits may be on the basis of language problems  but I think there are primary problems as well, because she also had difficulties with delayed recall of verbal information. She screened negative for the presence of depression and was characterized as functioning at a mild cognitive impairment level problem.    [name] is thus demonstrating a language problem characterized by deficient naming and repetition. She was also noted to have fairly significant comprehension deficits but she has no problems with single-word comprehension. Her presentation is concerning for a Primary Progressive Aphasia. She most closely resembles the logopenic subtype type and has some supportive features (e.g., memory impairment), although she does have some less characteristic findings in terms of surface dyslexia and the extent of her comprehension difficulties, which may make this an unclassifiable phenotype as per current criteria. Other etiologies need to be ruled out prior to definitive diagnosis and she certainly needs an MRI of the brain. This may be a particularly helpful diagnostic study in her case given the differential and I am happy to review the images with her if desired. She may benefit from speech and language pathology for rehabilitation and/or compensatory communication strategies.   Review of Systems: Out of a complete 14 system review, the patient complains of only the following symptoms, and all other reviewed systems are negative.:  Total = 2/ 24 points .   Husband feels the the score is closer to 12 points.   FSS endorsed at  17/ 63 points.   Social History   Socioeconomic History   Marital status: Married    Spouse name: Not on file   Number of children: 3   Years of education: Not on file   Highest education level: Not on file  Occupational History   Not on file  Tobacco Use   Smoking status: Never   Smokeless tobacco: Never  Vaping Use   Vaping status: Never Used  Substance and Sexual Activity   Alcohol use: Yes    Alcohol/week: 1.0  standard drink of alcohol    Types: 1 Glasses of wine per week    Comment: Per Week   Drug use: Never   Sexual activity: Yes  Other Topics Concern   Not on file  Social History Narrative   Left Handed    Lives in a two story home    Social Determinants of Health   Financial Resource Strain: Not on file  Food Insecurity: Not on file  Transportation Needs: Not on file  Physical Activity: Not on file  Stress: Not on file  Social Connections: Not on file    Family History  Problem Relation Age of Onset   Arthritis Mother    Asthma Mother    Hyperlipidemia Mother    Hearing loss Father    Hyperlipidemia Father    Hypertension Father    Heart Problems Father        Visual merchandiser   Macular degeneration Father    Heart attack Maternal Grandmother    Heart disease Maternal Grandmother    Hyperlipidemia Maternal Grandmother    Hypertension Maternal Grandmother    Early death Maternal Grandfather    Heart disease Paternal Grandmother    Hyperlipidemia Paternal Grandmother    Hypertension Paternal Grandmother    Heart attack Paternal Grandmother    Cancer Paternal Grandfather    Stroke Paternal Grandfather     Past Medical History:  Diagnosis Date   Allergy    Chicken pox    Frequent headaches    Migraine    Urinary tract infection     Past Surgical History:  Procedure Laterality Date   BREAST SURGERY  1985   TONSILLECTOMY  1975     Current Outpatient Medications on File Prior to Visit  Medication Sig Dispense Refill   b complex vitamins capsule Take 1 capsule by mouth daily.     Multiple Vitamins-Minerals (MULTIVITAMIN ADULT PO) Take 1 tablet by mouth daily. B1     naproxen sodium (ALEVE) 220 MG tablet Take 220 mg by mouth.     zinc gluconate 50 MG tablet Take 50 mg by mouth daily.     No current facility-administered medications on file prior to visit.    Allergies  Allergen Reactions   Pollen Extract     Seasonal allergies   Diclofenac Rash    Rash,  headache (oral diclofenac).     DIAGNOSTIC DATA (LABS, IMAGING, TESTING) - I reviewed patient records, labs, notes, testing and imaging myself where available.  Lab Results  Component Value Date   WBC 8.2 03/24/2022   HGB 14.3 03/24/2022   HCT 42.2 03/24/2022   MCV 89.4 03/24/2022   PLT 299.0 03/24/2022      Component Value Date/Time   NA 140 03/24/2022 1542   K 3.8 03/24/2022 1542   CL 104 03/24/2022 1542   CO2 28 03/24/2022 1542   GLUCOSE 83 03/24/2022 1542   BUN 12 03/24/2022 1542   CREATININE 0.75 03/24/2022 1542  CALCIUM 9.2 03/24/2022 1542   PROT 7.0 03/24/2022 1542   ALBUMIN 4.0 03/24/2022 1542   AST 20 03/24/2022 1542   ALT 22 03/24/2022 1542   ALKPHOS 102 03/24/2022 1542   BILITOT 0.3 03/24/2022 1542   GFRNONAA >60 02/18/2019 1247   GFRAA >60 02/18/2019 1247   Lab Results  Component Value Date   CHOL 174 03/24/2022   HDL 41.40 03/24/2022   LDLCALC 99 05/23/2020   LDLDIRECT 112.0 03/24/2022   TRIG 243.0 (H) 03/24/2022   CHOLHDL 4 03/24/2022   Lab Results  Component Value Date   HGBA1C 6.1 03/24/2022   Lab Results  Component Value Date   VITAMINB12 438 05/23/2020   Lab Results  Component Value Date   TSH 2.43 03/24/2022    PHYSICAL EXAM:  Today's Vitals   08/10/22 1316  BP: 121/78  Pulse: 84  Weight: 223 lb (101.2 kg)  Height: 5\' 6"  (1.676 m)   Body mass index is 35.99 kg/m.   Wt Readings from Last 3 Encounters:  08/10/22 223 lb (101.2 kg)  06/02/22 217 lb 4 oz (98.5 kg)  05/26/22 219 lb 3.2 oz (99.4 kg)     Ht Readings from Last 3 Encounters:  08/10/22 5\' 6"  (1.676 m)  06/02/22 5\' 6"  (1.676 m)  05/26/22 5\' 6"  (1.676 m)      General: The patient is awake, alert and appears not in acute distress. The patient is well groomed. Head: Normocephalic, atraumatic. Neck is supple.  Mallampati 3,  neck circumference:15.5  inches . Nasal airflow  patent.  No retrognathia is seen.  Dental status: braces were worn in her youth.   Cardiovascular:  Regular rate and cardiac rhythm by pulse,  without distended neck veins. Respiratory: Lungs are clear to auscultation.  Skin:  Without evidence of ankle edema, or rash. Trunk: The patient's posture is erect.   NEUROLOGIC EXAM: The patient is awake and alert, oriented to place and time.   Speech is non-fluent,  aphasia.  Mood and affect are appropriate.   Cranial nerves: no loss of smell or taste reported  Pupils are equal and briskly reactive to light. Funduscopic exam deferred. .  Extraocular movements in vertical and horizontal planes were intact and without nystagmus.  No Diplopia. Visual fields by finger perimetry are intact. Hearing was intact to soft voice and finger rubbing.    Facial sensation intact to fine touch.  Facial motor strength is symmetric and tongue and uvula move midline.  Neck ROM : rotation, tilt and flexion extension were normal for age and shoulder shrug was symmetrical.    Motor exam:  Symmetric bulk, tone and ROM.   Normal tone without cog wheeling, symmetric grip strength .   Sensory:  Fine touch, and vibration were tested  and  normal.  Proprioception tested in the upper extremities was normal.   Coordination: Rapid alternating movements in the fingers/hands were of normal speed.  The Finger-to-nose maneuver was intact without evidence of ataxia, dysmetria or tremor.   Gait and station: Patient could rise unassisted from a seated position, walked without assistive device.  Stance is of normal width/ base and the patient turned with 3 steps.  Toe and heel walk were deferred.  Deep tendon reflexes: in the  upper and lower extremities are symmetric and intact.  Babinski response was deferred .   ASSESSMENT AND PLAN 62 y.o. year old female  here with: mild OSA , recently tested in [place] for genetic  markers, GRN variant positive ,  had genetic counseling. May participate in a trial.      Chief concern according to patient :  Her  neurologist in [place] recommend to use a dental device, he agreed that CPAP would be harder on her ( and it is her husband who is bothered by her snoring).   Her AHI was 7.8/h AASM, 6.8 by CMS.   We will refer to dental device in spite of REM  dominance as we hope that snoring will be treated, and perhaps apnea a bit too.    I will refer to Dr Myrtis Ser and Toni Arthurs, depending on her insurance.  I plan to follow up either personally or through our NP within 6 months.   I would like to thank Sheliah Hatch, MD and Sheliah Hatch, Md [address] [place],  [place] [address] for allowing me to meet with and to take care of this pleasant patient.     After spending a total time of  25  minutes face to face and additional time for physical and neurologic examination, review of laboratory studies,  personal review of imaging studies, reports and results of other testing and review of referral information / records as far as provided in visit,   Electronically signed by: Melvyn Novas, MD 08/10/2022 1:59 PM  [facility] and [facility] Sleep Board certified by The [employer] of Sleep Medicine and Diplomate of the [employer] of Sleep Medicine. Board certified In Neurology through the ABPN, Fellow of the [employer] of Neurology.

## 2022-08-11 NOTE — Telephone Encounter (Signed)
Referral for dentistry fax to [facility]. Phone: [phone], Fax: [phone].

## 2022-08-23 ENCOUNTER — Telehealth (INDEPENDENT_AMBULATORY_CARE_PROVIDER_SITE_OTHER): Payer: 59 | Admitting: Family Medicine

## 2022-08-23 ENCOUNTER — Encounter: Payer: Self-pay | Admitting: Family Medicine

## 2022-08-23 DIAGNOSIS — G3109 Other frontotemporal dementia: Secondary | ICD-10-CM | POA: Diagnosis not present

## 2022-08-23 DIAGNOSIS — Q999 Chromosomal abnormality, unspecified: Secondary | ICD-10-CM

## 2022-08-23 DIAGNOSIS — F0280 Dementia in other diseases classified elsewhere without behavioral disturbance: Secondary | ICD-10-CM

## 2022-08-23 NOTE — Progress Notes (Unsigned)
Virtual Visit via Video   I connected with patient on 08/23/22 at  1:40 PM EDT by a video enabled telemedicine application and verified that I am speaking with the correct person using two identifiers.  Location patient: Home Location provider: [facility] [facility], Office Persons participating in the virtual visit: Patient, Provider, CMA Tresa Endo C)  I discussed the limitations of evaluation and management by telemedicine and the availability of in person appointments. The patient expressed understanding and agreed to proceed.  Subjective:   HPI:   Medical advice- son reports a lot has changed since our last visit.  Had an appt in July in [place] where they learned from the genetic counselor she has a new and totally different condition (as yet unnamed).  Pt's parent are going to be genetically tested.  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  They are now looking at studies across the country.  Suddenly neurologist in [place] that they have been seeing is much more excited and much more engaged.  Has already asked about postmortem brain donation- which was a shock to the family.  Son wants to know if they should see the new neurologist given that there is very little information regarding what mom has, and would it make a difference.  Sleep studies were normal.  New neuro appt on 8/27  Son is taking another gap year.  Dad will be stepping down from work.  ROS:   See pertinent positives and negatives per HPI.  Patient Active Problem List   Diagnosis Date Noted   Mild obstructive sleep apnea-hypopnea syndrome 08/10/2022   Semantic dementia (HCC) 03/25/2022   Daytime sleepiness 03/25/2022   Morbid obesity (HCC) 03/25/2022   Primary progressive aphasia (HCC) 08/05/2020   Neurocognitive disorder 08/05/2020   Elevated BP without diagnosis of hypertension 05/23/2020   Word finding difficulty 05/23/2020   Expressive speech disorder 05/23/2020   Acute pain of right knee 12/10/2017   Physical exam  09/16/2017    Social History   Tobacco Use   Smoking status: Never   Smokeless tobacco: Never  Substance Use Topics   Alcohol use: Yes    Alcohol/week: 1.0 standard drink of alcohol    Types: 1 Glasses of wine per week    Comment: Per Week    Current Outpatient Medications:    b complex vitamins capsule, Take 1 capsule by mouth daily., Disp: , Rfl:    Multiple Vitamins-Minerals (MULTIVITAMIN ADULT PO), Take 1 tablet by mouth daily. B1, Disp: , Rfl:    naproxen sodium (ALEVE) 220 MG tablet, Take 220 mg by mouth., Disp: , Rfl:    zinc gluconate 50 MG tablet, Take 50 mg by mouth daily., Disp: , Rfl:   Allergies  Allergen Reactions   Pollen Extract     Seasonal allergies   Diclofenac Rash    Rash, headache (oral diclofenac).    Objective:   There were no vitals taken for this visit. AA, NAD NCAT, EOMI No obvious CN deficits Coloring WNL Pt is able to speak clearly, coherently without shortness of breath or increased work of breathing.  Mood is appropriate.   Assessment and Plan:   Genetic defect/semantic dementia- the big new development in pt's care is that they have identified a gene mutation on the GRN gene that has not yet been seen and believed to be the cause of pt's semantic dementia/aphasia.  Neuro at [facility] is now having family call various specialized clinics around the country to ask about clinical trials.  Told  pt and son that they are able to push back and remind him that this is not an exciting new diagnosis, this is a person and a family member.  They can also ask that he or his office do some of the clinical trial research as not every family is able or equipped to navigate that world.  For now, agree that adding a new neurologist localy- w/o any specialized training or interest in her condition- is not needed at this time.  This will just lead to rehashing the last few years for the family and stress the pt.  Son was very appreciative of our discussion and feels he  has some better direction as to how to proceed.  Total time spent w/ pt and family, 37 minutes.   Neena Rhymes, MD 08/23/2022

## 2022-08-24 DIAGNOSIS — Q999 Chromosomal abnormality, unspecified: Secondary | ICD-10-CM | POA: Insufficient documentation

## 2022-12-29 ENCOUNTER — Ambulatory Visit: Payer: 59 | Admitting: Family Medicine

## 2022-12-29 ENCOUNTER — Encounter: Payer: Self-pay | Admitting: Family Medicine

## 2022-12-29 VITALS — BP 128/78 | HR 68 | Temp 98.1°F | Ht 66.0 in | Wt 221.2 lb

## 2022-12-29 DIAGNOSIS — R262 Difficulty in walking, not elsewhere classified: Secondary | ICD-10-CM

## 2022-12-29 DIAGNOSIS — F419 Anxiety disorder, unspecified: Secondary | ICD-10-CM

## 2022-12-29 DIAGNOSIS — E669 Obesity, unspecified: Secondary | ICD-10-CM

## 2022-12-29 DIAGNOSIS — G3109 Other frontotemporal dementia: Secondary | ICD-10-CM

## 2022-12-29 DIAGNOSIS — F0280 Dementia in other diseases classified elsewhere without behavioral disturbance: Secondary | ICD-10-CM

## 2022-12-29 MED ORDER — SERTRALINE HCL 25 MG PO TABS: 25.0000 mg | ORAL_TABLET | Freq: Every day | ORAL | 3 refills | Status: DC

## 2022-12-29 NOTE — Patient Instructions (Signed)
Follow up in 6 weeks to recheck anxiety We'll notify you of your lab results and make any changes if needed We will reach out to schedule home health and care management visits START the Sertraline daily for mood Try and set a daily schedule for eating, physical activity, bathing, mental activity (puzzles, etc) Call with any questions or concerns Stay Safe!  Stay Healthy! Hang in there! Happy Holidays!!

## 2022-12-29 NOTE — Progress Notes (Signed)
   Subjective:    Patient ID: Deanna [name], female    DOB: 03-Nov-1960, 62 y.o.   MRN: [id number]  HPI Care discussion- husband feels that he needs some support for caregiving.  He is hoping for some in-home help.  Having some issues w/ routine of showering, brushing teeth.  Doesn't need physical assistance with these tasks but needs to be reminded.  Husband would also like her to have some increased engagement.  She is not able to drive- gets very anxious as a passenger despite enjoying being out.  He is having a very hard time understanding what she is trying to say.  She has communication device but is not using it.  He is also worried about risk of secondary illness that she will not be able to communicate.  She has some issues going up and down stairs.  Has become more inactive as the weather got colder.     Review of Systems For ROS see HPI     Objective:   Physical Exam Constitutional:      General: She is not in acute distress.    Appearance: Normal appearance. She is well-developed. She is obese.  HENT:     Head: Normocephalic and atraumatic.  Eyes:     Conjunctiva/sclera: Conjunctivae normal.     Pupils: Pupils are equal, round, and reactive to light.  Neck:     Thyroid: No thyromegaly.  Cardiovascular:     Rate and Rhythm: Normal rate and regular rhythm.     Heart sounds: Normal heart sounds. No murmur heard. Pulmonary:     Effort: Pulmonary effort is normal. No respiratory distress.     Breath sounds: Normal breath sounds.  Abdominal:     General: There is no distension.     Palpations: Abdomen is soft.     Tenderness: There is no abdominal tenderness.  Musculoskeletal:     Cervical back: Normal range of motion and neck supple.  Lymphadenopathy:     Cervical: No cervical adenopathy.  Skin:    General: Skin is warm and dry.  Neurological:     Mental Status: She is alert.     Comments: Unable to communicate in words others understand  Psychiatric:        Behavior:  Behavior normal.           Assessment & Plan:

## 2022-12-30 ENCOUNTER — Telehealth: Payer: Self-pay | Admitting: *Deleted

## 2022-12-30 NOTE — Progress Notes (Signed)
Complex Care Management Note   12/30/2022 Name: Deanna [name] MRN: [id number] DOB: 04/01/1960  Deanna [name] is a 61 y.o. year old female who sees Tabori, Helane Rima, MD for primary care. I reached out to Deanna [name] by phone today to offer complex care management services.  [name] was given information about Complex Care Management services today including:   The Complex Care Management services include support from the care team which includes your Nurse Coordinator, Clinical Social Worker, or Pharmacist.  The Complex Care Management team is here to help remove barriers to the health concerns and goals most important to you. Complex Care Management services are voluntary, and the patient may decline or stop services at any time by request to their care team member.   Complex Care Management Consent Status: Patient agreed to services and verbal consent obtained.   Follow up plan:  Telephone appointment with complex care management team member scheduled for:  01/10/2023  Encounter Outcome:  Patient Scheduled  Burman Nieves, [facility] Care Coordination Care Guide Direct Dial: [phone]

## 2023-01-04 NOTE — Assessment & Plan Note (Signed)
New.  Husband reports gait is worsening and she is having a harder time navigating stairs due to poor balance.  He would welcome the idea of HH PT to improve balance, strength, and endurance so that she does not become sedentary.  Referral made

## 2023-01-04 NOTE — Assessment & Plan Note (Signed)
New.  Pt is having a hard time with her current situation.  She is frustrated and sad that others can't understand her.  She is now anxious leaving the house or participating in activities.  Will start Sertraline 25mg  daily and monitor closely for improvement using nonverbal cues.

## 2023-01-04 NOTE — Assessment & Plan Note (Signed)
Deteriorated.  Pt continues to gain weight as she finds comfort in food.  She was walking regularly when the weather was nice, but now that it's dark early and colder temps, she is not a faithful in her walks.  Husband is trying to get her to participate in home exercises but she is not interested.  Check labs to risk stratify.  Will follow.

## 2023-01-04 NOTE — Assessment & Plan Note (Signed)
Deteriorated.  Pt is now rarely making sense and even family that was able to previously understand her are no longer able to follow her thoughts.  This is frustrating for both pt and family.  She had previously worked w/ speech therapy and has a Librarian, academic but she is either refusing to use it or not sure how to.  Will refer back to Speech Therapy in hopes of training both pt and family how to use this device.  He is fearful she will develop illness/infxn that she is not able to convey.  His concern is worsened by the fact that she is not maintaining regular hygiene practices and needs someone to constantly remind her of when to do things like shower or brush teeth.  He is hoping that a [facility] aide would be of benefit.  Will consult SW, Care Management, Speech, PT in hopes of providing this sweet family w/ necessary resources.

## 2023-01-10 ENCOUNTER — Ambulatory Visit: Payer: Self-pay | Admitting: Licensed Clinical Social Worker

## 2023-01-10 ENCOUNTER — Telehealth: Payer: Self-pay

## 2023-01-10 NOTE — Patient Outreach (Signed)
  Care Coordination   Initial Visit Note   01/10/2023 Name: Deanna [name] MRN: [id number] DOB: [birth year]  Deanna [name] is a 62 y.o. year old female who sees Tabori, Helane Rima, MD for primary care. I spoke with  Deanna [name] and spouse, Deanna [name], by phone today.  What matters to the patients health and wellness today?  Caregiver Strain, Supportive Resources, Level of Care    Goals Addressed             This Visit's Progress    Obtain Supportive Resources-Caregiver Stress   On track    Activities and task to complete in order to accomplish goals.   Keep all upcoming appointments discussed today Continue with compliance of taking medication prescribed by Doctor Implement healthy coping skills discussed to assist with management of symptoms         SDOH assessments and interventions completed:  Yes  SDOH Interventions Today    Flowsheet Row Most Recent Value  SDOH Interventions   Food Insecurity Interventions Intervention Not Indicated  Housing Interventions Intervention Not Indicated  Transportation Interventions Intervention Not Indicated  Utilities Interventions Intervention Not Indicated        Care Coordination Interventions:  Yes, provided  Interventions Today    Flowsheet Row Most Recent Value  Chronic Disease   Chronic disease during today's visit Other  [Semantic Dementia and Anxiety]  General Interventions   General Interventions Discussed/Reviewed General Interventions Discussed, Programmer, applications, Doctor Visits, Level of Care, Communication with  Doctor Visits Discussed/Reviewed Doctor Visits Discussed  Communication with --  [facility] with Care Guide to schedule RNCM with family ot assist with management of chronic health conditions]  Level of Care Adult Daycare, Assisted Living, Skilled Nursing Facility  Exercise Interventions   Exercise Discussed/Reviewed Exercise Discussed, Physical Activity  [Patient has been referred to [facility] PT, ST, and RN through  [employer]. Has met with RN yesterday, Speech today at 2 PM, and PT appt on 12/31]  Education Interventions   Education Provided Provided Education  Provided Verbal Education On Applications, Insurance Plans  Mental Health Interventions   Mental Health Discussed/Reviewed Mental Health Discussed, Coping Strategies, Anxiety, Grief and Loss  [Caregiver stress]  Nutrition Interventions   Nutrition Discussed/Reviewed Nutrition Discussed  [Family utilizes [employer] to assist with meals. Interested in other options. Discussed [employer] Meals and [employer] (there is a wait-list)]  Pharmacy Interventions   Pharmacy Dicussed/Reviewed Pharmacy Topics Discussed, Medication Adherence  [Pt has initiated Sertraline and was taking for approx 10 days. On 12/29 and 12/30 she refused medication. Family will try to encourage her to re-start medication to assist with anxiety management]  Safety Interventions   Safety Discussed/Reviewed Safety Discussed, Fall Risk       Follow up plan: Follow up call scheduled for 2 weeks    Encounter Outcome:  Patient Visit Completed   Jenel Lucks, MSW, LCSW [facility] Care Management [facility]  [facility] [email or web address] Phone [phone] 3:28 PM

## 2023-01-10 NOTE — Telephone Encounter (Signed)
LM  to give verbal order to [place] on their secure VM as requested

## 2023-01-10 NOTE — Telephone Encounter (Signed)
Okay to provide verbal orders?

## 2023-01-10 NOTE — Telephone Encounter (Signed)
Copied from CRM [id number]. Topic: Clinical - Home Health Verbal Orders >> Jan 10, 2023  2:42 PM Thomes Dinning wrote: Caller/Agency: [place] Callback Number: [phone] Service Requested: Speech Therapy Frequency: 1 x week for 8 weeks Any new concerns about the patient? No

## 2023-01-10 NOTE — Patient Instructions (Signed)
Visit Information  Thank you for taking time to visit with me today. Please don't hesitate to contact me if I can be of assistance to you.   Following are the goals we discussed today:   Goals Addressed             This Visit's Progress    Obtain Supportive Resources-Caregiver Stress   On track    Activities and task to complete in order to accomplish goals.   Keep all upcoming appointments discussed today Continue with compliance of taking medication prescribed by Doctor Implement healthy coping skills discussed to assist with management of symptoms         Our next appointment is by telephone on 1/13 at 9 AM  Please call the care guide team at [phone] if you need to cancel or reschedule your appointment.   If you are experiencing a Mental Health or Behavioral Health Crisis or need someone to talk to, please call the Suicide and Crisis Lifeline: 988 call 911   Patient verbalizes understanding of instructions and care plan provided today and agrees to view in MyChart. Active MyChart status and patient understanding of how to access instructions and care plan via MyChart confirmed with patient.     Jenel Lucks, MSW, LCSW [facility] Care Management [place]  [facility] [email or web address] Phone [phone] 3:28 PM

## 2023-01-10 NOTE — Telephone Encounter (Signed)
Ok to provide orders

## 2023-01-13 ENCOUNTER — Telehealth: Payer: Self-pay | Admitting: Family Medicine

## 2023-01-13 NOTE — Telephone Encounter (Signed)
 Placed in folder at nurse station

## 2023-01-13 NOTE — Telephone Encounter (Signed)
 Home Health Certification or Plan of Care Tracking  Is this a Certification or Plan of Care? Plan of Care  [facility] Agency: [facility]  Order Number:  N/A  Has charge sheet been attached? Yes  Where has form been placed:   Labeled & placed in provider bin

## 2023-01-17 NOTE — Telephone Encounter (Signed)
 Faxed and placed in bin to be scanned

## 2023-01-17 NOTE — Telephone Encounter (Signed)
 Completed and returned to [place]

## 2023-01-18 ENCOUNTER — Telehealth: Payer: Self-pay | Admitting: *Deleted

## 2023-01-18 NOTE — Progress Notes (Signed)
 Complex Care Management Care Guide Note  01/18/2023 Name: [name] MRN: [id number] DOB: Jun 15, 1960  Deanna [name] is a 63 y.o. year old female who is a primary care patient of Mahlon, Katherine E, MD and is actively engaged with the care management team. I reached out to [name] by phone today to assist with scheduling  with the RN Case Manager.  Follow up plan: Unsuccessful telephone outreach attempt made. A HIPAA compliant phone message was left for the patient providing contact information and requesting a return call.  Thedford Franks, CCMA Care Coordination Care Guide Direct Dial: [phone]

## 2023-01-20 ENCOUNTER — Telehealth: Payer: Self-pay | Admitting: Family Medicine

## 2023-01-20 DIAGNOSIS — M25561 Pain in right knee: Secondary | ICD-10-CM | POA: Diagnosis not present

## 2023-01-20 DIAGNOSIS — Z6835 Body mass index (BMI) 35.0-35.9, adult: Secondary | ICD-10-CM

## 2023-01-20 DIAGNOSIS — E66811 Obesity, class 1: Secondary | ICD-10-CM | POA: Diagnosis not present

## 2023-01-20 DIAGNOSIS — G3101 Pick's disease: Secondary | ICD-10-CM | POA: Diagnosis not present

## 2023-01-20 DIAGNOSIS — F0284 Dementia in other diseases classified elsewhere, unspecified severity, with anxiety: Secondary | ICD-10-CM | POA: Diagnosis not present

## 2023-01-20 NOTE — Telephone Encounter (Signed)
 Placed in folder at nurse station

## 2023-01-20 NOTE — Telephone Encounter (Signed)
 Home Health Verbal Orders  Agency: [facility]   Caller:Angela/Mary RN Call back #: [phone] Fax #:[phone]    Requesting RN:    Placed in [facility] Sheet attached

## 2023-01-21 ENCOUNTER — Other Ambulatory Visit: Payer: Self-pay | Admitting: Family Medicine

## 2023-01-21 NOTE — Progress Notes (Signed)
 Pt previously scheduled for 1/13

## 2023-01-24 ENCOUNTER — Ambulatory Visit: Payer: Self-pay | Admitting: Licensed Clinical Social Worker

## 2023-01-24 NOTE — Patient Outreach (Signed)
  Care Coordination   Follow Up Visit Note   01/24/2023 Name: Deanna [name] MRN: [id number] DOB: 05-04-1960  Deanna [name] is a 63 y.o. year old female who sees Tabori, Comer BRAVO, MD for primary care. I spoke with  Deanna [name] spouse, Deanna [name], by phone today.  What matters to the patients health and wellness today?  Supportive resources, Level of care [facility])    Goals Addressed             This Visit's Progress    Obtain Supportive Resources-Caregiver Stress   On track    Activities and task to complete in order to accomplish goals.   Keep all upcoming appointments discussed today Continue with compliance of taking medication prescribed by Doctor Implement healthy coping skills discussed to assist with management of symptoms Follow up with [facility] regarding RNCM and PT scheduling Review supportive resources provided via email         SDOH assessments and interventions completed:  Yes  SDOH Interventions Today    Flowsheet Row Most Recent Value  SDOH Interventions   Food Insecurity Interventions Other (Comment)  [Provided info on Mom's Meals]        Care Coordination Interventions:  Yes, provided  Interventions Today    Flowsheet Row Most Recent Value  Chronic Disease   Chronic disease during today's visit Other  [Sementic Dementia and Anxiety]  General Interventions   General Interventions Discussed/Reviewed General Interventions Reviewed, Doctor Visits, Level of Care, Community Resources  APPLE COMPUTER sent info on [employer]'s Meals]  Doctor Visits Discussed/Reviewed Doctor Visits Reviewed  Level of Care --  [facility] for [facility], PT, ST]  Mental Health Interventions   Mental Health Discussed/Reviewed Mental Health Reviewed, Coping Strategies, Anxiety  Safety Interventions   Safety Discussed/Reviewed Safety Reviewed, Fall Risk       Follow up plan: Follow up call scheduled for 02/04    Encounter Outcome:  Patient Visit Completed   Deanna [name], MSW, LCSW [facility] Care  Management [facility]  [facility] [email or web address] Phone [phone] 9:37 AM

## 2023-01-24 NOTE — Patient Instructions (Signed)
 Visit Information  Thank you for taking time to visit with me today. Please don't hesitate to contact me if I can be of assistance to you.   Following are the goals we discussed today:   Goals Addressed             This Visit's Progress    Obtain Supportive Resources-Caregiver Stress   On track    Activities and task to complete in order to accomplish goals.   Keep all upcoming appointments discussed today Continue with compliance of taking medication prescribed by Doctor Implement healthy coping skills discussed to assist with management of symptoms Follow up with [facility] regarding RNCM and PT scheduling Review supportive resources provided via email         Our next appointment is by telephone on 02/04 at 10:00 AM  Please call the care guide team at [phone] if you need to cancel or reschedule your appointment.   If you are experiencing a Mental Health or Behavioral Health Crisis or need someone to talk to, please call the Suicide and Crisis Lifeline: 988 call 911   Patient verbalizes understanding of instructions and care plan provided today and agrees to view in MyChart. Active MyChart status and patient understanding of how to access instructions and care plan via MyChart confirmed with patient.     Dyan Labarbera, MSW, LCSW [facility] Care Management [place]  [facility] [email or web address] Phone [phone] 9:38 AM

## 2023-01-25 NOTE — Telephone Encounter (Signed)
 Faxed and placed in bin to be faxed

## 2023-01-25 NOTE — Telephone Encounter (Signed)
 Forms completed and returned to [place]

## 2023-01-26 ENCOUNTER — Telehealth: Payer: Self-pay

## 2023-01-26 NOTE — Telephone Encounter (Signed)
Okay to provide verbal orders?

## 2023-01-26 NOTE — Telephone Encounter (Signed)
 Copied from CRM [id number]. Topic: Clinical - Home Health Verbal Orders >> Jan 26, 2023  4:03 PM Artemio Larry wrote: Caller/Agency: Moira Andrews from [facility] Callback Number: [phone] Service Requested: Physical Therapy Frequency: Once a week for 5 weeks, strengthen and balance program  Any new concerns about the patient? N/A

## 2023-01-26 NOTE — Telephone Encounter (Signed)
Called and provided verbal orders.  

## 2023-01-26 NOTE — Telephone Encounter (Signed)
 Ok for verbal orders ?

## 2023-01-28 ENCOUNTER — Telehealth: Payer: Self-pay | Admitting: Family Medicine

## 2023-01-28 NOTE — Telephone Encounter (Signed)
Placed in folder at nurse station

## 2023-01-28 NOTE — Telephone Encounter (Signed)
Home Health Certification or Plan of Care Tracking  Is this a Certification or Plan of Care? Plan of Care  [facility] Agency: [facility]  Order Number:  [id number]  Has charge sheet been attached? Yes  Where has form been placed:   Labeled & placed in provider bin

## 2023-02-01 NOTE — Telephone Encounter (Signed)
Signed and returned to [place]

## 2023-02-01 NOTE — Telephone Encounter (Signed)
Faxed

## 2023-02-09 ENCOUNTER — Ambulatory Visit: Payer: 59 | Admitting: Family Medicine

## 2023-02-09 ENCOUNTER — Encounter: Payer: Self-pay | Admitting: Family Medicine

## 2023-02-10 ENCOUNTER — Other Ambulatory Visit (HOSPITAL_BASED_OUTPATIENT_CLINIC_OR_DEPARTMENT_OTHER): Payer: Self-pay

## 2023-02-10 MED ORDER — SERTRALINE HCL 50 MG PO TABS
50.0000 mg | ORAL_TABLET | Freq: Every day | ORAL | 3 refills | Status: DC
Start: 2023-02-10 — End: 2023-03-14
  Filled 2023-02-10: qty 30, 30d supply, fill #0

## 2023-02-10 NOTE — Addendum Note (Signed)
Addended by: Sheliah Hatch on: 02/10/2023 03:48 PM   Modules accepted: Orders

## 2023-02-11 ENCOUNTER — Telehealth: Payer: Self-pay

## 2023-02-11 ENCOUNTER — Encounter: Payer: Self-pay | Admitting: Neurology

## 2023-02-11 NOTE — Telephone Encounter (Signed)
Copied from CRM [id number]. Topic: General - Other >> Feb 11, 2023  2:27 PM [place] A wrote: Reason for CRM: Marcelino Duster with [facility] called and said they will be deterring treatments for two weeks due to patient having anxiety and at husbands reqeust

## 2023-02-11 NOTE — Telephone Encounter (Signed)
 noted

## 2023-02-11 NOTE — Telephone Encounter (Signed)
FYI from [facility] about treatment

## 2023-02-14 ENCOUNTER — Telehealth: Payer: Self-pay | Admitting: Neurology

## 2023-02-14 ENCOUNTER — Ambulatory Visit: Payer: 59 | Admitting: Neurology

## 2023-02-14 NOTE — Telephone Encounter (Signed)
Pt's husband, Maribell Demeo said patient had a hard week and is refusing to come to appointment.

## 2023-02-15 ENCOUNTER — Ambulatory Visit: Payer: Self-pay | Admitting: Licensed Clinical Social Worker

## 2023-02-15 NOTE — Patient Instructions (Signed)
Visit Information  Thank you for taking time to visit with me today. Please don't hesitate to contact me if I can be of assistance to you.   Following are the goals we discussed today:   Goals Addressed             This Visit's Progress    Obtain Supportive Resources-Caregiver Stress   On track    Activities and task to complete in order to accomplish goals.   Keep all upcoming appointments discussed today Continue with compliance of taking medication prescribed by Doctor Implement healthy coping skills discussed to assist with management of symptoms Follow up with [facility] regarding RNCM and PT scheduling Review supportive resources provided via email         Our next appointment is by telephone on 2/18 at 10 AM  Please call the care guide team at [phone] if you need to cancel or reschedule your appointment.   If you are experiencing a Mental Health or Behavioral Health Crisis or need someone to talk to, please call the Suicide and Crisis Lifeline: 988 call 911   Patient verbalizes understanding of instructions and care plan provided today and agrees to view in MyChart. Active MyChart status and patient understanding of how to access instructions and care plan via MyChart confirmed with patient.     Windy Fast [facility] Health  [facility], [facility] Clinical Social Worker Direct Dial: [phone]  Fax: [phone] Website: [email or web address] 10:46 AM

## 2023-02-15 NOTE — Patient Outreach (Signed)
  Care Coordination   Follow Up Visit Note   02/15/2023 Name: Deanna [name] MRN: [id number] DOB: Nov 17, 1960  Deanna [name] is a 63 y.o. year old female who sees Tabori, Katherine E, MD for primary care. I spoke with  Deanna [name]'s spouse by phone today.  What matters to the patients health and wellness today?  Level of Care, Symptom Management, Caregiver Stress, Resources    Goals Addressed             This Visit's Progress    Obtain Supportive Resources-Caregiver Stress   On track    Activities and task to complete in order to accomplish goals.   Keep all upcoming appointments discussed today Continue with compliance of taking medication prescribed by Doctor Implement healthy coping skills discussed to assist with management of symptoms Follow up with [facility] regarding RNCM and PT scheduling Review supportive resources provided via email         SDOH assessments and interventions completed:  No     Care Coordination Interventions:  Yes, provided  Interventions Today    Flowsheet Row Most Recent Value  Chronic Disease   Chronic disease during today's visit Other  [Semantic Dementia, Anxiety, Neurocognitive Disorder]  General Interventions   General Interventions Discussed/Reviewed Doctor Visits, General Interventions Reviewed, Level of Care  Doctor Visits Discussed/Reviewed Doctor Visits Reviewed  Level of Care Assisted Living, Skilled Nursing Facility, Personal Care Services  Exercise Interventions   Exercise Discussed/Reviewed Exercise Reviewed  [Spouse paused [facility] services for approximately two weeks to assist with pt's overwhelm to all of pt's appts. Spouse continues to encourage her to take walks]  Mental Health Interventions   Mental Health Discussed/Reviewed Mental Health Reviewed, Coping Strategies, Anxiety  [Spouse is trying to encourage pt to do self-care and daily tasks. Family observed anxiety has decreased while traveling in vehicle. Spouse utilizes  journaling and will continue to communicate with pt about appt]  Nutrition Interventions   Nutrition Discussed/Reviewed Nutrition Reviewed  Pharmacy Interventions   Pharmacy Dicussed/Reviewed Pharmacy Topics Reviewed, Medication Adherence  Safety Interventions   Safety Discussed/Reviewed Safety Reviewed       Follow up plan: Follow up call scheduled for 2 weeks    Encounter Outcome:  Patient Visit Completed   Rolin Kerns, LCSW [place]  [facility], [facility] Clinical Social Worker Direct Dial: [phone]  Fax: [phone] Website: [email or web address] 10:45 AM

## 2023-02-16 ENCOUNTER — Telehealth: Payer: Self-pay | Admitting: Family Medicine

## 2023-02-16 NOTE — Telephone Encounter (Signed)
 Home Health Certification or Plan of Care Tracking  Is this a Certification or Plan of Care? Plan of Care  [facility] Agency: [facility]  Order Number:  [id number]  Has charge sheet been attached? Yes  Where has form been placed:   Labeled & placed in provider bin

## 2023-02-16 NOTE — Telephone Encounter (Signed)
 Placed in folder at nurse station

## 2023-02-17 NOTE — Telephone Encounter (Signed)
 Form completed and returned to [place]

## 2023-02-17 NOTE — Telephone Encounter (Signed)
 Faxed and placed in scan bin

## 2023-02-24 ENCOUNTER — Telehealth: Payer: Self-pay

## 2023-02-24 NOTE — Telephone Encounter (Signed)
Copied from CRM [id number]. Topic: Clinical - Home Health Verbal Orders >> Feb 24, 2023  2:38 PM Hector Shade B wrote: [facility] Number: [phone]  Physical Therapy was offered to patient and for the past 3 to 4 weks patient has declined services, therapy is now being discontinued if you have any questions please  speak with the above name party

## 2023-02-24 NOTE — Telephone Encounter (Signed)
FYI about patients care.

## 2023-03-01 ENCOUNTER — Ambulatory Visit: Payer: Self-pay | Admitting: Licensed Clinical Social Worker

## 2023-03-02 NOTE — Patient Outreach (Signed)
  Care Coordination   Follow Up Visit Note   03/01/2023 Name: Deanna [name] MRN: [id number] DOB: 31-May-1960  Deanna [name] is a 63 y.o. year old female who sees Tabori, Helane Rima, MD for primary care. I spoke with  Deanna [name]'s spouse by phone today.  What matters to the patients health and wellness today?  Level of Care, Symptom Management    Goals Addressed             This Visit's Progress    Obtain Supportive Resources-Caregiver Stress   On track    Activities and task to complete in order to accomplish goals.   Keep all upcoming appointments discussed today Continue with compliance of taking medication prescribed by Doctor Implement healthy coping skills discussed to assist with management of symptoms Follow up with [facility] regarding RNCM and PT scheduling Review supportive resources provided via email         SDOH assessments and interventions completed:  No     Care Coordination Interventions:  Yes, provided  Interventions Today    Flowsheet Row Most Recent Value  General Interventions   General Interventions Discussed/Reviewed Level of Care  Doctor Visits Discussed/Reviewed Doctor Visits Reviewed  [Neurologist appt has rescheduled and has upcoming appt with PCP. He will reach out to specialist to recommend any support groups]  Level of Care --  [Pt continued to decline HH services.]  Exercise Interventions   Exercise Discussed/Reviewed Exercise Reviewed, Physical Activity  [Spouse continues to encourage pt to increase physical activity]  Mental Health Interventions   Mental Health Discussed/Reviewed Mental Health Reviewed, Coping Strategies, Anxiety, Depression  [Family are prioritizing self-care activities (hair salon) to assist with management of symptoms. She has increased doing ADLs]  Nutrition Interventions   Nutrition Discussed/Reviewed Nutrition Reviewed  Pharmacy Interventions   Pharmacy Dicussed/Reviewed Pharmacy Topics Reviewed, Medication  Adherence  [Pt's change to Zoloft 50 mg is making pt fatigued, returned to 25 mg dose.]       Follow up plan: Follow up call scheduled for 4-6 weeks    Encounter Outcome:  Patient Visit Completed   Jenel Lucks, LCSW [place]  [facility], [facility] Clinical Social Worker Direct Dial: [phone]  Fax: [phone] Website: [email or web address] 6:47 AM

## 2023-03-02 NOTE — Patient Instructions (Signed)
 Visit Information  Thank you for taking time to visit with me today. Please don't hesitate to contact me if I can be of assistance to you.   Following are the goals we discussed today:   Goals Addressed             This Visit's Progress    Obtain Supportive Resources-Caregiver Stress   On track    Activities and task to complete in order to accomplish goals.   Keep all upcoming appointments discussed today Continue with compliance of taking medication prescribed by Doctor Implement healthy coping skills discussed to assist with management of symptoms Follow up with [facility] regarding RNCM and PT scheduling Review supportive resources provided via email         Our next appointment is by telephone on 4/1 at 10 AM  Please call the care guide team at [phone] if you need to cancel or reschedule your appointment.   If you are experiencing a Mental Health or Behavioral Health Crisis or need someone to talk to, please call the Suicide and Crisis Lifeline: 988 call 911   Patient verbalizes understanding of instructions and care plan provided today and agrees to view in MyChart. Active MyChart status and patient understanding of how to access instructions and care plan via MyChart confirmed with patient.     Windy Fast [facility] Health  [facility], [facility] Clinical Social Worker Direct Dial: [phone]  Fax: [phone] Website: [email or web address] 6:48 AM

## 2023-03-14 ENCOUNTER — Telehealth (INDEPENDENT_AMBULATORY_CARE_PROVIDER_SITE_OTHER): Payer: 59 | Admitting: Family Medicine

## 2023-03-14 ENCOUNTER — Encounter: Payer: Self-pay | Admitting: Family Medicine

## 2023-03-14 ENCOUNTER — Other Ambulatory Visit (HOSPITAL_BASED_OUTPATIENT_CLINIC_OR_DEPARTMENT_OTHER): Payer: Self-pay

## 2023-03-14 DIAGNOSIS — F419 Anxiety disorder, unspecified: Secondary | ICD-10-CM | POA: Diagnosis not present

## 2023-03-14 MED ORDER — SERTRALINE HCL 50 MG PO TABS
50.0000 mg | ORAL_TABLET | Freq: Every day | ORAL | 3 refills | Status: DC
  Filled 2023-03-14: qty 30, 30d supply, fill #0
  Filled 2023-04-12: qty 30, 30d supply, fill #1
  Filled 2023-05-11: qty 30, 30d supply, fill #2
  Filled 2023-06-14: qty 30, 30d supply, fill #3

## 2023-03-14 NOTE — Progress Notes (Signed)
 Virtual Visit via Video   I connected with patient on 03/14/23 at 11:00 AM EST by a video enabled telemedicine application and verified that I am speaking with the correct person using two identifiers.  Location patient: Home Location provider: [place], Office Persons participating in the virtual visit: Patient, Provider, CMA Archie Patten H)  I discussed the limitations of evaluation and management by telemedicine and the availability of in person appointments. The patient expressed understanding and agreed to proceed.  Subjective:   HPI:   Anxiety- Husband reports that starting Sertraline last year was 'a Secretary/administrator'.  She did not want to participate in the various therapies PT/OT, speech.  At that time, we increased Sertraline to 50mg  daily.  Went to [place] the first week of Feb and she was able to spend a lot of time w/ family.  Husband reports 'we had a great week'.  Upon return, pt was very fatigued and stayed in bed.  He was worried the increased dose of medication was causing low energy.  We decreased back to 25mg  daily.  Husband reports in the last week her anxiety level/panic attacks has 'skyrocketed'.  He does note that she had been getting [employer] and feels that caffeine worsens her anxiety.  She gets very angry at husband when he tries to limit her sugar intake- she wants to eat ice cream, donuts, candy, etc.  Husband is struggling to manage her anger.  ROS:   See pertinent positives and negatives per HPI.  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   Mild obstructive sleep apnea-hypopnea syndrome 08/10/2022   Semantic dementia (HCC) 03/25/2022   Daytime sleepiness 03/25/2022   Obesity (BMI 30-39.9) 03/25/2022   Primary progressive aphasia (HCC) 08/05/2020   Neurocognitive disorder 08/05/2020   Elevated BP without diagnosis of hypertension 05/23/2020   Word  finding difficulty 05/23/2020   Expressive speech disorder 05/23/2020   Acute pain of right knee 12/10/2017   Physical exam 09/16/2017    Social History   Tobacco Use   Smoking status: Never   Smokeless tobacco: Never  Substance Use Topics   Alcohol use: Yes    Alcohol/week: 1.0 standard drink of alcohol    Types: 1 Glasses of wine per week    Comment: Per Week    Current Outpatient Medications:    acetaminophen (TYLENOL) 325 MG suppository, Place 325 mg rectally every 4 (four) hours as needed., Disp: , Rfl:    b complex vitamins capsule, Take 1 capsule by mouth daily., Disp: , Rfl:    Multiple Vitamins-Minerals (MULTIVITAMIN ADULT PO), Take 1 tablet by mouth daily. B1, Disp: , Rfl:    naproxen sodium (ALEVE) 220 MG tablet, Take 220 mg by mouth., Disp: , Rfl:    zinc gluconate 50 MG tablet, Take 50 mg by mouth daily., Disp: , Rfl:    sertraline (ZOLOFT) 50 MG tablet, Take 1 tablet (50 mg total) by mouth daily. (Patient not taking: Reported on 03/14/2023), Disp: 30 tablet, Rfl: 3  Allergies  Allergen Reactions   Pollen Extract     Seasonal allergies   Diclofenac Rash    Rash, headache (oral diclofenac).    Objective:   There were no vitals taken for this visit. AAOx3, NAD NCAT, EOMI No obvious CN deficits Coloring WNL Pt is able to speak without shortness of breath or increased work of breathing.  + expressive aphasia Mood  is appropriate.   Assessment and Plan:   Anxiety- deteriorated.  Husband initially felt starting Sertraline was a 'Secretary/administrator' for pt.  We increased dose to 50mg  and pt went on trip to [place] to visit family.  This was overwhelming for her and energy level regressed- increased sleeping.  Husband was worried that increased med dose was contributing to fatigue.  Decreased back to 25mg  daily.  Since decreasing dose, he feels pt anxiety has 'skyrocketed'.  He notes a correlation w/ worsening anxiety and caffeine intake.  He has tried to limit her intake.  Will  increase Sertraline back to 50mg  daily and follow closely.  Husband- and pt- expressed understanding and agreement.   Neena Rhymes, MD 03/14/2023

## 2023-03-17 ENCOUNTER — Other Ambulatory Visit (HOSPITAL_BASED_OUTPATIENT_CLINIC_OR_DEPARTMENT_OTHER): Payer: Self-pay

## 2023-03-18 ENCOUNTER — Telehealth: Payer: Self-pay | Admitting: Family Medicine

## 2023-03-18 NOTE — Telephone Encounter (Signed)
 Called Pt left message to call the office. Did not mention it's about $30 copay from 03/14/23 visit

## 2023-03-18 NOTE — Telephone Encounter (Unsigned)
 Copied from CRM [id number]. Topic: General - Other >> Mar 18, 2023  4:30 PM Jon Gills C wrote: Reason for CRM: Patient Husband called in regarding missed call from [place] No[place], is requesting a callback

## 2023-03-23 NOTE — Telephone Encounter (Signed)
 Called, no answer.

## 2023-03-29 ENCOUNTER — Ambulatory Visit: Payer: 59 | Admitting: Neurology

## 2023-03-30 ENCOUNTER — Telehealth: Payer: Self-pay

## 2023-03-30 NOTE — Telephone Encounter (Signed)
 Copied from CRM [id number]. Topic: Clinical - Medical Advice >> Mar 30, 2023  2:56 PM Isabell A wrote: Reason for CRM: Patients son Molly Maduro is calling to get advice from Dr.Tabori - would like to know what she thinks about the supplement Zeolite drops.   Callback number: [phone], requesting a call twice in case it goes to voicemail.

## 2023-03-30 NOTE — Telephone Encounter (Signed)
 Left vm to call office back. This message can be read to pt or son

## 2023-03-30 NOTE — Telephone Encounter (Signed)
 There is very little scientific research to back up any of the claims made regarding Zeolite supplements.  I'm not for or against them at this time, but if it were me, I would not use them or spend the money until more is known

## 2023-03-31 NOTE — Telephone Encounter (Signed)
 Copied from CRM [id number]. Topic: General - Other >> Mar 30, 2023  4:24 PM Almira Coaster wrote: Reason for CRM: Patient is returning a call she received from [facility], opened encounter and read over Dr.Tabori's message.

## 2023-04-11 ENCOUNTER — Telehealth: Payer: Self-pay | Admitting: Family Medicine

## 2023-04-11 NOTE — Telephone Encounter (Signed)
 Error on last note Placed in folder at nurse station

## 2023-04-11 NOTE — Telephone Encounter (Signed)
 Home Health Certification or Plan of Care Tracking  Is this a Certification or Plan of Care? Plan of Care  [facility] Agency: [facility]  Order Number:  [id number]  Has charge sheet been attached? Yes  Where has form been placed:   Labeled & placed in provider bin

## 2023-04-11 NOTE — Telephone Encounter (Signed)
Placed in folder at front desk.

## 2023-04-12 ENCOUNTER — Ambulatory Visit: Payer: 59 | Admitting: Licensed Clinical Social Worker

## 2023-04-12 DIAGNOSIS — R419 Unspecified symptoms and signs involving cognitive functions and awareness: Secondary | ICD-10-CM

## 2023-04-12 NOTE — Telephone Encounter (Signed)
 Lakesha from [facility] called stating they did not receive the signed order from 02/17/23. This was Order # [id number]. A copy of this order has been indexed into the patient's chart so I printed it and re-faxed it.

## 2023-04-15 NOTE — Patient Instructions (Signed)
 Visit Information  Thank you for taking time to visit with me today. Please don't hesitate to contact me if I can be of assistance to you.   Following are the goals we discussed today:   Goals Addressed             This Visit's Progress    Obtain Supportive Resources-Caregiver Stress   On track    Activities and task to complete in order to accomplish goals.   Keep all upcoming appointments discussed today Continue with compliance of taking medication prescribed by Doctor Implement healthy coping skills discussed to assist with management of symptoms Follow up with [facility] regarding RNCM and PT scheduling Review supportive resources provided via email Follow up with PCP and/or [facility] regarding order for OT         Our next appointment is by telephone on 5/27 at 10 AM  Please call the care guide team at [phone] if you need to cancel or reschedule your appointment.   If you are experiencing a Mental Health or Behavioral Health Crisis or need someone to talk to, please call the Suicide and Crisis Lifeline: 988 go to [facility] Urgent [facility] [address] [place] [phone]) call 911   Patient verbalizes understanding of instructions and care plan provided today and agrees to view in MyChart. Active MyChart status and patient understanding of how to access instructions and care plan via MyChart confirmed with patient.     Windy Fast [facility]  [facility], [facility] Clinical Social Worker Direct Dial: [phone]  Fax: [phone] Website: [email or web address] 1:08 PM

## 2023-04-15 NOTE — Patient Outreach (Signed)
 Care Coordination   Follow Up Visit Note   04/12/2023 Name: MONSERRAT VIDAURRI MRN: [id number] DOB: 1960/08/26  Gwendolyn Fill is a 63 y.o. year old female who sees Tabori, Helane Rima, MD for primary care. I spoke with  Gwendolyn Fill by phone today.  What matters to the patients health and wellness today?  Symptom Management    Goals Addressed             This Visit's Progress    Obtain Supportive Resources-Caregiver Stress   On track    Activities and task to complete in order to accomplish goals.   Keep all upcoming appointments discussed today Continue with compliance of taking medication prescribed by Doctor Implement healthy coping skills discussed to assist with management of symptoms Follow up with [facility] regarding RNCM and PT scheduling Review supportive resources provided via email Follow up with PCP and/or [facility] regarding order for OT         SDOH assessments and interventions completed:  No     Care Coordination Interventions:  Yes, provided  Interventions Today    Flowsheet Row Most Recent Value  Chronic Disease   Chronic disease during today's visit Other  [Semantic Dementia and Anxiety]  General Interventions   General Interventions Discussed/Reviewed General Interventions Reviewed, [facility], Doctor Visits, Level of Care  Doctor Visits Discussed/Reviewed Doctor Visits Reviewed  Exercise Interventions   Exercise Discussed/Reviewed Exercise Reviewed  Mental Health Interventions   Mental Health Discussed/Reviewed Mental Health Reviewed, Coping Strategies, Anxiety  Nutrition Interventions   Nutrition Discussed/Reviewed Nutrition Reviewed  Pharmacy Interventions   Pharmacy Dicussed/Reviewed Pharmacy Topics Reviewed, Medication Adherence  Safety Interventions   Safety Discussed/Reviewed Safety Reviewed       Follow up plan: Follow up call scheduled for 4-6 weeks    Encounter Outcome:  Patient Visit Completed

## 2023-04-19 NOTE — Telephone Encounter (Signed)
 Faxed

## 2023-04-19 NOTE — Telephone Encounter (Signed)
 Form completed and returned to [place]

## 2023-04-22 NOTE — Addendum Note (Signed)
 Addended by: Sheliah Hatch on: 04/22/2023 01:54 PM   Modules accepted: Orders

## 2023-05-12 ENCOUNTER — Encounter: Payer: Self-pay | Admitting: Family Medicine

## 2023-05-12 ENCOUNTER — Telehealth (INDEPENDENT_AMBULATORY_CARE_PROVIDER_SITE_OTHER): Admitting: Family Medicine

## 2023-05-12 DIAGNOSIS — R419 Unspecified symptoms and signs involving cognitive functions and awareness: Secondary | ICD-10-CM

## 2023-05-12 NOTE — Progress Notes (Signed)
   Virtual Visit via Video   I connected with patient on 05/12/23 at 10:20 AM EDT by a video enabled telemedicine application and verified that I am speaking with the correct person using two identifiers.  Location patient: Home Location provider: [facility], Office Persons participating in the virtual visit: Patient, Provider, CMA Cyndee Dragon S)  I discussed the limitations of evaluation and management by telemedicine and the availability of in person appointments. The patient expressed understanding and agreed to proceed.  Subjective:   HPI:   Neurocognitive disorder- husband reports he is having a very difficult time getting her to shower/bathe.  Today he was not able to get her to leave the house for her appt.  This past weekend she refused to stay at the hair salon.  Yesterday only got out of bed to eat and use the restroom.  He reports that any type of social activity is exhausting- for almost an entire week.  ROS:   See pertinent positives and negatives per HPI.  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   Mild obstructive sleep apnea-hypopnea syndrome 08/10/2022   Semantic dementia (HCC) 03/25/2022   Daytime sleepiness 03/25/2022   Obesity (BMI 30-39.9) 03/25/2022   Primary progressive aphasia (HCC) 08/05/2020   Neurocognitive disorder 08/05/2020   Elevated BP without diagnosis of hypertension 05/23/2020   Word finding difficulty 05/23/2020   Expressive speech disorder 05/23/2020   Acute pain of right knee 12/10/2017   Physical exam 09/16/2017    Social History   Tobacco Use   Smoking status: Never   Smokeless tobacco: Never  Substance Use Topics   Alcohol use: Yes    Alcohol/week: 1.0 standard drink of alcohol    Types: 1 Glasses of wine per week    Comment: Per Week    Current Outpatient Medications:    Multiple Vitamins-Minerals (MULTIVITAMIN  ADULT PO), Take 1 tablet by mouth daily. B1, Disp: , Rfl:    sertraline  (ZOLOFT ) 50 MG tablet, Take 1 tablet (50 mg total) by mouth daily., Disp: 30 tablet, Rfl: 3   zinc gluconate 50 MG tablet, Take 50 mg by mouth daily., Disp: , Rfl:    acetaminophen  (TYLENOL ) 325 MG suppository, Place 325 mg rectally every 4 (four) hours as needed. (Patient not taking: Reported on 05/12/2023), Disp: , Rfl:    naproxen sodium (ALEVE) 220 MG tablet, Take 220 mg by mouth. (Patient not taking: Reported on 05/12/2023), Disp: , Rfl:   Allergies  Allergen Reactions   Pollen Extract     Seasonal allergies   Diclofenac  Rash    Rash, headache (oral diclofenac ).    Objective:   There were no vitals taken for this visit. AAO, NAD, lying in bed NCAT, EOMI No obvious CN deficits Coloring WNL Pt is able to wave and say hello   Assessment and Plan:   Neurocognitive d/o- ongoing issue.  Pt is more obstinate in her unwillingness to do things- this morning would not come to her appt.  She is refusing to shower or bathe.  Some days will not get out of bed.  Husband is interested in new OT referral b/c he wasn't impressed w/ [facility].  Spoke to [facility] and would like them to come work w/ pt in hopes of improving her routine.  Referral placed.   Laymon Priest, MD 05/12/2023

## 2023-05-13 NOTE — Addendum Note (Signed)
 Addended by: Shekira Drummer E on: 05/13/2023 11:07 AM   Modules accepted: Orders

## 2023-06-07 ENCOUNTER — Ambulatory Visit: Payer: Self-pay | Admitting: Licensed Clinical Social Worker

## 2023-06-07 NOTE — Patient Instructions (Signed)
 Visit Information  Thank you for taking time to visit with me today. Please don't hesitate to contact me if I can be of assistance to you before our next scheduled appointment.  Your next care management appointment is by telephone on 07/12/2023.   Please call the care guide team at [phone] if you need to cancel, schedule, or reschedule an appointment.   Please call the Suicide and Crisis Lifeline: 988 if you are experiencing a Mental Health or Behavioral Health Crisis or need someone to talk to.  Hale Level, LCSW [facility], [facility] Licensed Clinical Social Worker Care Coordinator [phone]

## 2023-06-07 NOTE — Patient Outreach (Signed)
 Complex Care Management   Visit Note  06/07/2023  Name:  Deanna [name] MRN: [id number] DOB: Jul 07, 1960  Situation: Referral received for Complex Care Management related to Dementia I obtained verbal consent from Patient.  Visit completed with patient  on the phone  Background:   Past Medical History:  Diagnosis Date   Allergy    Chicken pox    Frequent headaches    Migraine    Urinary tract infection     Assessment: Patient Reported Symptoms:  Cognitive Cognitive Status: Confused or disoriented, Poor judgment in daily scenarios, Struggling with memory recall Cognitive/Intellectual Conditions Management [RPT]: None reported or documented in medical history or problem list   Health Maintenance Behaviors: None Healing Pattern: Unsure  Neurological Neurological Review of Symptoms: No symptoms reported Neurological Conditions: Dementia Neurological Management Strategies: Medication therapy Neurological Self-Management Outcome: 3 (uncertain) Neurological Comment: Home health ordered by proivder, considering additional in home help options  HEENT HEENT Symptoms Reported: Not assessed      Cardiovascular Cardiovascular Symptoms Reported: Not assessed    Respiratory Respiratory Symptoms Reported: Not assesed    Endocrine Patient reports the following symptoms related to hypoglycemia or hyperglycemia : Not assessed    Gastrointestinal Gastrointestinal Symptoms Reported: Not assessed      Genitourinary Genitourinary Symptoms Reported: Not assessed    Integumentary Integumentary Symptoms Reported: Not assessed    Musculoskeletal Musculoskelatal Symptoms Reviewed: Not assessed        Psychosocial Psychosocial Symptoms Reported: Other Other Psychosocial Conditions: Spouse reports flater affect since starting Zoloft  - pt's anxiety has improved with Zoloft . CSW encouraged spouse to discuss with provider on adjusting medication.     Quality of Family Relationships: helpful,  involved, supportive Do you feel physically threatened by others?: No      12/29/2022   12:52 PM  Depression screen PHQ 2/9  Decreased Interest 1  Down, Depressed, Hopeless 1  PHQ - 2 Score 2  Altered sleeping 0  Tired, decreased energy 1  Change in appetite 1  Feeling bad or failure about yourself  1  Trouble concentrating 2  Moving slowly or fidgety/restless 0  Suicidal thoughts 0  PHQ-9 Score 7  Difficult doing work/chores Extremely dIfficult    There were no vitals filed for this visit.  Medications Reviewed Today     Reviewed by Jens Molder, LCSW (Social Worker) on 06/07/23 at 1010  Med List Status: <None>   Medication Order Taking? Sig Documenting Provider Last Dose Status Informant  Multiple Vitamins-Minerals (MULTIVITAMIN ADULT PO) [id number] Yes Take 1 tablet by mouth daily. B1 [provider] Taking Active   sertraline  (ZOLOFT ) 50 MG tablet [id number] Yes Take 1 tablet (50 mg total) by mouth daily. Tabori, Katherine E, MD Taking Active   zinc gluconate 50 MG tablet [id number] Yes Take 50 mg by mouth daily. [provider] Taking Active             Recommendation:   Continue to utilize coping skills as needed for caregiver stress. Consider familiarizing self with local memory care facilities for possible future placement.   Follow Up Plan:   Telephone follow up appointment date/time:  07/12/2023  Hale Level, LCSW [facility], [facility] Licensed Clinical Social Worker Care Coordinator [phone]

## 2023-06-14 ENCOUNTER — Encounter: Payer: Self-pay | Admitting: Family Medicine

## 2023-06-14 NOTE — Telephone Encounter (Signed)
 FYI

## 2023-06-16 ENCOUNTER — Telehealth: Payer: Self-pay | Admitting: Family Medicine

## 2023-06-16 NOTE — Telephone Encounter (Signed)
 This is not in folder in front office Please advise where this is located ?

## 2023-06-16 NOTE — Telephone Encounter (Signed)
 Orders was sent from [facility] and needs signatures and sent back to its designated place. Paper work is placed in Chartered loss adjuster.  Please advise, Thanks

## 2023-06-21 NOTE — Telephone Encounter (Signed)
 Faxed back to requested number by Tonya

## 2023-06-22 ENCOUNTER — Telehealth: Payer: Self-pay

## 2023-06-22 ENCOUNTER — Encounter: Payer: Self-pay | Admitting: Family Medicine

## 2023-06-22 NOTE — Telephone Encounter (Signed)
 Home health is coming Monday to draw blood work and want to ensure you had the opportunity to request labs if you needed any updated

## 2023-06-22 NOTE — Telephone Encounter (Signed)
 Copied from CRM [id number]. Topic: Clinical - Home Health Verbal Orders >> Jun 22, 2023  1:03 PM Magdalene School wrote: Caller/Agency: Ananias Balls occupational therapist [facility]  Callback Number: [phone] Service Requested: Skilled Nursing Frequency: 1 added visit. Any new concerns about the patient? Yes Requesting medication like seroquel to allow patient to tolerate home health visits, she still has moderate to severe anxiety, refuses to change clothes and hair is still matted. Patient remains in her room all day long and won't come out of bed.  Consider referral to Dr. Alba Ally for dementia evaluation, he has over 25 years of experience dealing with cases like hers.

## 2023-06-22 NOTE — Telephone Encounter (Signed)
 Patient messaging in about OT requesting blood work to be completed through [facility] for Monday 06/27/2023  and is wondering if you could order the blood work? Tried calling [facility] to figure out what lab work is being requested and did look in chart to see of they had called and left a phone note but I do not see that either. Did leave VM for [facility] to call back.

## 2023-06-22 NOTE — Telephone Encounter (Signed)
 Deanna [name] from [facility] is requesting a medication like seroquel to help patient tolerate home health visits. Deanna [name] states she still struggles with moderate to sever anxiet?Deanna [name] is also wondering if you would consider a referral to Dr. Alba Ally for dementia evaluation?

## 2023-06-22 NOTE — Telephone Encounter (Signed)
 I would be fine referring to Dr Levie Ream, but the pt and family have to be on board with this.  I cannot just refer.  Especially since she has been having a hard time leaving the house.  Sending her somewhere new will be a challenge.  Also, I am happy to adjust medications, but we need a video visit to do so.  Please have pt/husband schedule at their convenience.

## 2023-06-23 ENCOUNTER — Other Ambulatory Visit: Payer: Self-pay

## 2023-06-23 ENCOUNTER — Telehealth: Payer: Self-pay | Admitting: Family Medicine

## 2023-06-23 ENCOUNTER — Other Ambulatory Visit (HOSPITAL_BASED_OUTPATIENT_CLINIC_OR_DEPARTMENT_OTHER): Payer: Self-pay

## 2023-06-23 ENCOUNTER — Encounter: Payer: Self-pay | Admitting: Family Medicine

## 2023-06-23 ENCOUNTER — Telehealth: Admitting: Family Medicine

## 2023-06-23 VITALS — BP 92/60 | HR 58 | Temp 97.2°F

## 2023-06-23 DIAGNOSIS — E669 Obesity, unspecified: Secondary | ICD-10-CM

## 2023-06-23 DIAGNOSIS — F0280 Dementia in other diseases classified elsewhere without behavioral disturbance: Secondary | ICD-10-CM

## 2023-06-23 DIAGNOSIS — G3109 Other frontotemporal dementia: Secondary | ICD-10-CM | POA: Diagnosis not present

## 2023-06-23 DIAGNOSIS — R419 Unspecified symptoms and signs involving cognitive functions and awareness: Secondary | ICD-10-CM

## 2023-06-23 MED ORDER — QUETIAPINE FUMARATE 50 MG PO TABS
50.0000 mg | ORAL_TABLET | Freq: Every day | ORAL | 3 refills | Status: AC
  Filled 2023-06-23: qty 30, 30d supply, fill #0
  Filled 2023-07-18: qty 30, 30d supply, fill #1
  Filled 2023-08-18: qty 30, 30d supply, fill #2
  Filled 2023-09-17: qty 30, 30d supply, fill #3

## 2023-06-23 MED ORDER — SERTRALINE HCL 50 MG PO TABS
50.0000 mg | ORAL_TABLET | Freq: Every day | ORAL | 3 refills | Status: DC
  Filled 2023-06-23: qty 90, 90d supply, fill #0

## 2023-06-23 NOTE — Telephone Encounter (Signed)
 Called and stated we had sent everything over to Dr.tabori to discuss with patient and her husband during their video visit today and we will reach out with any extra information needed.

## 2023-06-23 NOTE — Telephone Encounter (Signed)
 Called Home Health and relayed these labs work orders.

## 2023-06-23 NOTE — Telephone Encounter (Signed)
 Please let HH know that we are going to draw  CBC w/ diff CMP Lipid panel TSH A1C Vit D 25 hydroxy  Dx for all- Obesity

## 2023-06-23 NOTE — Progress Notes (Signed)
 Virtual Visit via Video   I connected with patient on 06/23/23 at  1:00 PM EDT by a video enabled telemedicine application and verified that I am speaking with the correct person using two identifiers.  Location patient: Home Location provider: A[facility] Body, Office Persons participating in the virtual visit: Patient, Provider, CMA Nancyann Aye F)  I discussed the limitations of evaluation and management by telemedicine and the availability of in person appointments. The patient expressed understanding and agreed to proceed.  Subjective:   HPI:   Semantic Dementia- pt is resting comfortably in bed but husband states she is not able to cooperate in ADLs.  Since last visit, she went to the beach w/ her family.  She participated in going to the beach and pool during that time.  Not having panic attacks but she is not expressing happiness either.  Husband reports last shower was 4/12.  She did change clothes frequently while at the beach- at home tends to stay in the same clothes.  Will brush her teeth and wash her hands when prompted.  Yesterday had both caregiver and OT at the house.  Will be pleasant to caregiver but stays in bed.  OT recommended starting Seroquel to improve cooperation.  ROS:   See pertinent positives and negatives per HPI.  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   Mild obstructive sleep apnea-hypopnea syndrome 08/10/2022   Semantic dementia (HCC) 03/25/2022   Daytime sleepiness 03/25/2022   Obesity (BMI 30-39.9) 03/25/2022   Primary progressive aphasia (HCC) 08/05/2020   Neurocognitive disorder 08/05/2020   Elevated BP without diagnosis of hypertension 05/23/2020   Word finding difficulty 05/23/2020   Expressive speech disorder 05/23/2020   Acute pain of right knee 12/10/2017   Physical exam 09/16/2017    Social History   Tobacco Use   Smoking  status: Never   Smokeless tobacco: Never  Substance Use Topics   Alcohol use: Yes    Alcohol/week: 1.0 standard drink of alcohol    Types: 1 Glasses of wine per week    Comment: Per Week    Current Outpatient Medications:    OVER THE COUNTER MEDICATION, Balance of nature supplement-three times a day, Disp: , Rfl:    Multiple Vitamins-Minerals (MULTIVITAMIN ADULT PO), Take 1 tablet by mouth daily. B1, Disp: , Rfl:    sertraline  (ZOLOFT ) 50 MG tablet, Take 1 tablet (50 mg total) by mouth daily., Disp: 30 tablet, Rfl: 3  Allergies  Allergen Reactions   Pollen Extract     Seasonal allergies   Diclofenac  Rash    Rash, headache (oral diclofenac ).    Objective:   BP 92/60 Comment: vitals performed during home visit with OT on 6/11--jaf  Pulse (!) 58   Temp (!) 97.2 F (36.2 C)   SpO2 93%   Lying in bed, no acute distress NCAT, EOMI No obvious CN deficits Coloring WNL Pt is able to say hello, wave, but not answer questions appropriately   Assessment and Plan:   Semantic Dementia/Neurocognitive d/o- ongoing.  Husband is very sweet with pt but doesn't seem to make her do things she doesn't want to do.  HH doesn't feel she is cooperating enough w/ ADL's- not bathing, dressing, etc.  They are recommending Seroquel to help.  Discussed this medication w/ pt and husband- will start at 50mg  nightly and titrate prn.  Will hold off on Geri-psych as pt  is not willing to leave the house or go to appts.  Obesity- HH is coming to house on Monday to do lab work.  Will get CBC w/ diff, CMP, TSH, lipid, A1C.   Laymon Priest, MD 06/23/2023

## 2023-06-23 NOTE — Telephone Encounter (Signed)
 Copied from CRM [id number]. Topic: General - Other >> Jun 22, 2023  3:36 PM Chuck Crater wrote: Reason for CRM: Melissa with [facility] is returning a call from [place]. Please call Melissa back.

## 2023-06-24 ENCOUNTER — Telehealth: Payer: Self-pay | Admitting: Family Medicine

## 2023-06-24 NOTE — Telephone Encounter (Signed)
 Home Health orders was sent from [facility] and needs signatures and sent back to its designated place. Paper work is placed in providers box up front.  Please advise, Thanks

## 2023-06-24 NOTE — Telephone Encounter (Signed)
 Orders was sent from [facility] and needs signatures and sent back to its designated place. Paper work is placed in providers box up front.  Please advise, Thanks

## 2023-06-24 NOTE — Telephone Encounter (Signed)
 Papers placed in sign folder at nurses station

## 2023-06-28 ENCOUNTER — Telehealth: Payer: Self-pay

## 2023-06-28 NOTE — Telephone Encounter (Signed)
 Forms completed and returned to [place]

## 2023-06-28 NOTE — Telephone Encounter (Signed)
 Copied from CRM [id number]. Topic: General - Other >> Jun 28, 2023  2:01 PM Aisha D wrote: Reason for CRM: Claryce Cruel, RN, with [facility] stated that the pt refused to have the labs done that were ordered by the provider. Claryce Cruel stated that they will try again in 2 weeks.

## 2023-06-28 NOTE — Telephone Encounter (Signed)
Faxed and placed in scan folder 

## 2023-06-28 NOTE — Telephone Encounter (Signed)
 Faxed and placed in scan bin

## 2023-06-28 NOTE — Telephone Encounter (Signed)
 FYI from [facility]

## 2023-06-29 ENCOUNTER — Telehealth: Payer: Self-pay | Admitting: Family Medicine

## 2023-06-29 DIAGNOSIS — F801 Expressive language disorder: Secondary | ICD-10-CM

## 2023-06-29 DIAGNOSIS — F02818 Dementia in other diseases classified elsewhere, unspecified severity, with other behavioral disturbance: Secondary | ICD-10-CM | POA: Diagnosis not present

## 2023-06-29 DIAGNOSIS — G3101 Pick's disease: Secondary | ICD-10-CM | POA: Diagnosis not present

## 2023-06-29 DIAGNOSIS — Z79899 Other long term (current) drug therapy: Secondary | ICD-10-CM

## 2023-06-29 DIAGNOSIS — E669 Obesity, unspecified: Secondary | ICD-10-CM

## 2023-06-29 DIAGNOSIS — Z6834 Body mass index (BMI) 34.0-34.9, adult: Secondary | ICD-10-CM

## 2023-06-29 DIAGNOSIS — F0284 Dementia in other diseases classified elsewhere, unspecified severity, with anxiety: Secondary | ICD-10-CM | POA: Diagnosis not present

## 2023-06-29 DIAGNOSIS — G4733 Obstructive sleep apnea (adult) (pediatric): Secondary | ICD-10-CM | POA: Diagnosis not present

## 2023-06-29 NOTE — Telephone Encounter (Signed)
 Placed in your sign folder

## 2023-06-29 NOTE — Telephone Encounter (Signed)
 Home Health Certification or Plan of Care Tracking  Is this a Certification or Plan of Care? Plan of Care  [facility] Agency: [facility]  Order Number:  [id number]  Has charge sheet been attached? Yes  Where has form been placed:   Labeled & placed in provider bin

## 2023-07-04 ENCOUNTER — Telehealth: Payer: Self-pay

## 2023-07-04 NOTE — Telephone Encounter (Signed)
 Copied from CRM [id number]. Topic: General - Other >> Jul 04, 2023 11:18 AM Macario HERO wrote: Reason for CRM: Shayla from [facility] called said they faxed an order 6/3 and calling for a status on orders sent. Also advised will re-fax it today. Direct number: [phone] Fax: [phone]

## 2023-07-04 NOTE — Telephone Encounter (Signed)
 Refaxed document as requested

## 2023-07-05 NOTE — Telephone Encounter (Signed)
 faxed

## 2023-07-05 NOTE — Telephone Encounter (Signed)
 Forms signed and returned to [facility]

## 2023-07-08 ENCOUNTER — Telehealth: Payer: Self-pay

## 2023-07-08 NOTE — Telephone Encounter (Signed)
 Copied from CRM [id number]. Topic: Referral - Status >> Jul 08, 2023  2:40 PM Drema MATSU wrote: Reason for CRM: Eleanor stated that she called the office for Dr. Tasia and they don't have a referral for patient and she really need it for a televisit. [address] [place], [place] Fax: [phone]

## 2023-07-08 NOTE — Telephone Encounter (Signed)
 Copied from CRM [id number]. Topic: Clinical - Home Health Verbal Orders >> Jul 08, 2023  2:19 PM Mercedes MATSU wrote: Caller/Agency: [facility] Callback Number: [phone] (secure line, can leave a voicemail) Service Requested: N/a Frequency: N/a Any new concerns about the patient? Yes Melissa states hat patient has improved a little. Able to give sponge bath, unable to give. Shower. Patient may need her Zoloft  increased, and her anxiety is starting to come back while riding in the car. Vital signs were also starting to elevate as well. On shower days, they are hoping she can maybe be prescribed a xanax. Patient is better on syerquil, but still not helping with shower time. Melissa states she will send in a note as well.

## 2023-07-08 NOTE — Telephone Encounter (Signed)
 I do not have enough information to complete this request and there is no call back number or information on where she is from.  I have to close this phone note

## 2023-07-08 NOTE — Telephone Encounter (Signed)
 Request for an increase to patients Zoloft  as well as request for a xanax for shower days. Please advise

## 2023-07-10 MED ORDER — SERTRALINE HCL 100 MG PO TABS
100.0000 mg | ORAL_TABLET | Freq: Every day | ORAL | 3 refills | Status: DC
  Filled 2023-07-10: qty 30, 30d supply, fill #0
  Filled 2023-08-04: qty 30, 30d supply, fill #1
  Filled 2023-09-04: qty 30, 30d supply, fill #2

## 2023-07-10 MED ORDER — ALPRAZOLAM 0.5 MG PO TABS
0.5000 mg | ORAL_TABLET | Freq: Two times a day (BID) | ORAL | 1 refills | Status: AC | PRN
  Filled 2023-07-10: qty 30, 15d supply, fill #0

## 2023-07-10 NOTE — Telephone Encounter (Signed)
 I sent in a prescription for Sertraline  100mg  daily (the next higher dose).  She can take 2 of what she has at home (50mg ) and then 1 when she picks up the new script.  I also sent a prescription for Alprazolam to take as needed for high anxiety

## 2023-07-10 NOTE — Addendum Note (Signed)
 Addended by: Oyinkansola Truax E on: 07/10/2023 11:19 AM   Modules accepted: Orders

## 2023-07-11 ENCOUNTER — Other Ambulatory Visit (HOSPITAL_BASED_OUTPATIENT_CLINIC_OR_DEPARTMENT_OTHER): Payer: Self-pay

## 2023-07-11 NOTE — Telephone Encounter (Signed)
 Called and informed Deanna [name] that the medication request has been completed and that I would also be informing the family, Deanna [name] was grateful and notes she is hopeful this will make her amenable to a shower and other care. Deanna [name] stated that she called the office for Dr. Tasia and they don't have a referral for patient and she really needs this for a televisit. Please advise as I do not see an active referral for psychiatric care  [address] [place], [place] Fax: [phone]  Called family number, no answer, LM requesting someone call back to discuss update to her medications.

## 2023-07-11 NOTE — Telephone Encounter (Signed)
 Family declined referral when I had my last visit with them as they didn't feel that she would tolerate a visit with a new provider.

## 2023-07-12 ENCOUNTER — Other Ambulatory Visit: Payer: Self-pay | Admitting: Licensed Clinical Social Worker

## 2023-07-12 NOTE — Patient Instructions (Signed)
 Visit Information  Thank you for taking time to visit with me today. Please don't hesitate to contact me if I can be of assistance to you before our next scheduled appointment.  Your next care management appointment is no further scheduled appointments.   Please call the care guide team at [phone] if you need to cancel, schedule, or reschedule an appointment.   Please call the Suicide and Crisis Lifeline: 988 if you are experiencing a Mental Health or Behavioral Health Crisis or need someone to talk to.  Hale Level, LCSW [place], [facility] Licensed Clinical Social Worker Care Coordinator [phone]

## 2023-07-12 NOTE — Patient Outreach (Signed)
 Complex Care Management   Visit Note  07/12/2023  Name:  [name] MRN: [id number] DOB: 1960-06-24  Situation: Referral received for Complex Care Management related to Dementia I obtained verbal consent from Caregiver.  Visit completed with caregiver  on the phone  Background:   Past Medical History:  Diagnosis Date   Allergy    Chicken pox    Frequent headaches    Migraine    Urinary tract infection     Assessment: Patient Reported Symptoms:  Cognitive Cognitive Status: Confused or disoriented, Struggling with memory recall, Poor judgment in daily scenarios      Neurological Neurological Review of Symptoms: No symptoms reported    HEENT HEENT Symptoms Reported: No symptoms reported      Cardiovascular Cardiovascular Symptoms Reported: Not assessed    Respiratory Respiratory Symptoms Reported: Not assesed    Endocrine Endocrine Symptoms Reported: Not assessed    Gastrointestinal Gastrointestinal Symptoms Reported: Not assessed      Genitourinary Genitourinary Symptoms Reported: Not assessed    Integumentary Integumentary Symptoms Reported: Not assessed    Musculoskeletal Musculoskelatal Symptoms Reviewed: Not assessed        Psychosocial Psychosocial Symptoms Reported: Not assessed            12/29/2022   12:52 PM  Depression screen PHQ 2/9  Decreased Interest 1  Down, Depressed, Hopeless 1  PHQ - 2 Score 2  Altered sleeping 0  Tired, decreased energy 1  Change in appetite 1  Feeling bad or failure about yourself  1  Trouble concentrating 2  Moving slowly or fidgety/restless 0  Suicidal thoughts 0  PHQ-9 Score 7  Difficult doing work/chores Extremely dIfficult    There were no vitals filed for this visit.  Medications Reviewed Today   Medications were not reviewed in this encounter     Recommendation:   Caregiver reports they have been active with adjusting medication regimen and have brought in a caregiver that is working with the pt. Pt  will start OT to assist with patient care. CSW and caregiver discussed placement further and financial costs. Caregiver will continue to keep pt in the home for now.  Follow Up Plan:   Patient has met all care management goals. Care Management case will be closed. Patient has been provided contact information should new needs arise.   Alm Armor, LCSW [place], [facility] Licensed Clinical Social Worker Care Coordinator [phone]

## 2023-07-12 NOTE — Telephone Encounter (Signed)
 Called Deanna [name] to inform her of this, no answer and no VM will wait for call back

## 2023-07-14 ENCOUNTER — Telehealth: Payer: Self-pay

## 2023-07-14 ENCOUNTER — Telehealth: Payer: Self-pay | Admitting: Family Medicine

## 2023-07-14 NOTE — Telephone Encounter (Signed)
 Home Health Certification or Plan of Care Tracking  Is this a Certification or Plan of Care? Plan of Care  [facility] Agency: [facility]  Order Number:  [id number]  Has charge sheet been attached? Yes  Where has form been placed:   Labeled & placed in provider bin

## 2023-07-14 NOTE — Telephone Encounter (Signed)
 Labs are not required if she is not cooperating.  And they do not need to be fasting.

## 2023-07-14 NOTE — Telephone Encounter (Signed)
 Copied from CRM [id number]. Topic: General - Other >> Jul 14, 2023  9:41 AM Mesmerise C wrote: Reason for CRM: Leita from [facility] stating a case manager went to admit pt was uncooperative due to dementia and was unable to get blood drawn went to get her labs cbc and lipid panel caregiver was uncooperative will only let the nurse come between 2-4pm for the labs nurse won't have enough time to submit labs cancelling visit today and scheduling next week but doesn't know what to do moving forward since having trouble trying to work with the husband which is the caregiver she's inquiring does patient have to have those labs and have to be fasting please give a call back to see how to assist at [phone] may leave vm

## 2023-07-14 NOTE — Telephone Encounter (Signed)
 Placed in folder at nurse station

## 2023-07-14 NOTE — Telephone Encounter (Signed)
 Message has been relayed to caregiver and she expressed understanding

## 2023-07-18 NOTE — Telephone Encounter (Signed)
Form signed and returned to [place]

## 2023-07-18 NOTE — Telephone Encounter (Signed)
Faxed and placed in scan folder 

## 2023-07-22 ENCOUNTER — Telehealth: Payer: Self-pay | Admitting: Family Medicine

## 2023-07-22 NOTE — Telephone Encounter (Signed)
 Paperwork completed and placed in fax bin at back nurse station

## 2023-07-22 NOTE — Telephone Encounter (Signed)
 Placed in Dr. Levora folder at nurse station

## 2023-07-22 NOTE — Telephone Encounter (Signed)
 Home Health Certification or Plan of Care Tracking  Is this a Certification or Plan of Care? Plan of Care  [facility] Agency: [facility]  Order Number:  [id number]  Has charge sheet been attached? Yes  Where has form been placed:   Labeled & placed in provider bin

## 2023-08-10 ENCOUNTER — Telehealth: Payer: Self-pay | Admitting: Family Medicine

## 2023-08-10 NOTE — Telephone Encounter (Signed)
 Home Health Certification or Plan of Care Tracking  Is this a Certification or Plan of Care? Plan of Care  [facility] Agency: [facility]  Order Number:  [id number]  Has charge sheet been attached? Yes  Where has form been placed:   Labeled & placed in provider bin

## 2023-08-10 NOTE — Telephone Encounter (Signed)
 Obtained documents from front desk and placed in providers folder for review

## 2023-08-10 NOTE — Telephone Encounter (Signed)
 Paperwork completed and placed in fax bin at back nurse station

## 2023-08-11 NOTE — Telephone Encounter (Signed)
 Called Deanna [name] back. I explained that Dr. Levora and Dr. Jerrell are taking over her paperwork until she returns the first week of office. She stated she will send new documents for Dr. Levora to sign.    Copied from CRM [id number]. Topic: General - Other >> Aug 11, 2023 10:01 AM Mesmerise C wrote: Reason for CRM: Deanna [name] from [facility] got signed orders back but it was signed by Dr. Levora and needs signed by Dr. Mahlon instead and faxed to [phone]

## 2023-08-11 NOTE — Telephone Encounter (Signed)
 Faxed paperwork and scanned to documents for patients chart. Placed in provider folder at front desk.

## 2023-08-25 NOTE — Telephone Encounter (Signed)
 Received these order forms again for Dr. Mahlon to sign Placed in provider bin

## 2023-08-30 ENCOUNTER — Telehealth: Payer: Self-pay

## 2023-08-30 NOTE — Telephone Encounter (Signed)
 Type of form received: [facility] Order request   Additional comments: sign date and return   Received by: fax  Form should be Faxed/mailed to: [phone]  Is patient requesting call for pickup: no  Form placed:  in provider file at front desk   Attach charge sheet.  Provider will determine charge.  Individual made aware of 3-5 business day turn around No?

## 2023-08-30 NOTE — Telephone Encounter (Signed)
 Placed in providers pink folder at nurse station for review

## 2023-08-31 NOTE — Telephone Encounter (Unsigned)
 Copied from CRM #[id number]. Topic: General - Other >> Aug 31, 2023  9:05 AM Berneda FALCON wrote: Reason for CRM: Shayla from [facility] is calling check on the status of the homehealth plan of care order. They faxed it in on 7/31 and 8/14. I see the order in there, but it is not yet signed. Please have Dr Levora sign and fax back. (This is under Dr. Levora because Mahlon was out) so Dr Levora has to sign for it please.  Shayla-[phone]

## 2023-08-31 NOTE — Telephone Encounter (Signed)
 Called Shayla back and left a detailed message. We have sent order already sign by Levora. There are two more orders waiting to be signed by provider. I told [name] that once they are signed and sent, I would call her and confirm that she received them.

## 2023-09-01 NOTE — Telephone Encounter (Signed)
 Forms were faxed to [facility]. Scanned and placed in provider folder at front desk.

## 2023-09-01 NOTE — Telephone Encounter (Signed)
 Form completed and returned to [place]

## 2023-09-09 ENCOUNTER — Telehealth: Payer: Self-pay | Admitting: Family Medicine

## 2023-09-09 NOTE — Telephone Encounter (Signed)
 Placed in providers folder at nurse station to review/sign

## 2023-09-09 NOTE — Telephone Encounter (Signed)
 Home Health Certification or Plan of Care Tracking  Is this a Certification or Plan of Care? Plan of Care  [facility] Agency: [facility]  Order Number:  [id number]  Has charge sheet been attached? Yes  Where has form been placed:   Labeled & placed in provider bin

## 2023-09-15 ENCOUNTER — Telehealth: Payer: Self-pay | Admitting: Family Medicine

## 2023-09-15 NOTE — Telephone Encounter (Signed)
 Home Health Certification or Plan of Care Tracking  Is this a Certification or Plan of Care? Plan of Care  [facility] Agency: [facility]  Order Number:  [id number]  Has charge sheet been attached? Yes  Where has form been placed:   Labeled & placed in provider bin

## 2023-09-15 NOTE — Telephone Encounter (Signed)
 Obtained documents and placed in providers folder at nurse station

## 2023-09-19 ENCOUNTER — Other Ambulatory Visit: Payer: Self-pay

## 2023-09-19 ENCOUNTER — Telehealth: Admitting: Family Medicine

## 2023-09-19 ENCOUNTER — Other Ambulatory Visit (HOSPITAL_BASED_OUTPATIENT_CLINIC_OR_DEPARTMENT_OTHER): Payer: Self-pay

## 2023-09-19 ENCOUNTER — Encounter: Payer: Self-pay | Admitting: Family Medicine

## 2023-09-19 VITALS — BP 92/60 | HR 58 | Wt 220.0 lb

## 2023-09-19 DIAGNOSIS — F419 Anxiety disorder, unspecified: Secondary | ICD-10-CM

## 2023-09-19 DIAGNOSIS — R419 Unspecified symptoms and signs involving cognitive functions and awareness: Secondary | ICD-10-CM

## 2023-09-19 MED ORDER — SERTRALINE HCL 100 MG PO TABS
100.0000 mg | ORAL_TABLET | Freq: Every day | ORAL | 1 refills | Status: AC
  Filled 2023-09-19 – 2023-10-06 (×2): qty 90, 90d supply, fill #0
  Filled 2023-12-28: qty 90, 90d supply, fill #1

## 2023-09-19 MED ORDER — QUETIAPINE FUMARATE 50 MG PO TABS
50.0000 mg | ORAL_TABLET | Freq: Every day | ORAL | 1 refills | Status: AC
  Filled 2023-10-16: qty 90, 90d supply, fill #0
  Filled 2024-01-09: qty 90, 90d supply, fill #1

## 2023-09-19 NOTE — Telephone Encounter (Signed)
 Forms signed and returned to [facility]

## 2023-09-19 NOTE — Telephone Encounter (Signed)
 Faxed back.

## 2023-09-19 NOTE — Progress Notes (Signed)
 Virtual Visit via Video   I connected with patient on 09/19/23 at  9:00 AM EDT by a video enabled telemedicine application and verified that I am speaking with the correct person using two identifiers.  Location patient: Home Location provider: [facility] [place], Office Persons participating in the virtual visit: Patient, Provider, CMA (Sheena B)  I discussed the limitations of evaluation and management by telemedicine and the availability of in person appointments. The patient expressed understanding and agreed to proceed.  Subjective:   HPI:   Started Seroquel  last visit, increased Sertraline  to 100mg .  Has only required Alprazolam  5 times.  Was able to have someone come to the house and cut her hair.  She is now allowing husband to brush her hair.  As of August 11 moved Seroquel  to lunch rather than bedtime and feels that this has made a big difference.  Husband feels anxiety is much better- is able to go out and do errands, allowing sponge baths.  Has a caregiver coming 4 days/week- does meal prep, feeds pt, hygiene.  Also has OT, RN through [facility].  ROS:   See pertinent positives and negatives per HPI.  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   Mild obstructive sleep apnea-hypopnea syndrome 08/10/2022   Semantic dementia (HCC) 03/25/2022   Daytime sleepiness 03/25/2022   Obesity (BMI 30-39.9) 03/25/2022   Primary progressive aphasia (HCC) 08/05/2020   Neurocognitive disorder 08/05/2020   Elevated BP without diagnosis of hypertension 05/23/2020   Word finding difficulty 05/23/2020   Expressive speech disorder 05/23/2020   Acute pain of right knee 12/10/2017   Physical exam 09/16/2017    Social History   Tobacco Use   Smoking status: Never   Smokeless tobacco: Never  Substance Use Topics   Alcohol use: Yes    Alcohol/week: 1.0 standard drink of alcohol     Types: 1 Glasses of wine per week    Comment: Per Week    Current Outpatient Medications:    ALPRAZolam  (XANAX ) 0.5 MG tablet, Take 1 tablet (0.5 mg total) by mouth 2 (two) times daily as needed for anxiety., Disp: 30 tablet, Rfl: 1   OVER THE COUNTER MEDICATION, Balance of nature supplement-three times a day, Disp: , Rfl:    QUEtiapine  (SEROQUEL ) 50 MG tablet, Take 1 tablet (50 mg total) by mouth at bedtime., Disp: 30 tablet, Rfl: 3   sertraline  (ZOLOFT ) 100 MG tablet, Take 1 tablet (100 mg total) by mouth daily., Disp: 30 tablet, Rfl: 3   Multiple Vitamins-Minerals (MULTIVITAMIN ADULT PO), Take 1 tablet by mouth daily. B1, Disp: , Rfl:   Allergies  Allergen Reactions   Pollen Extract     Seasonal allergies   Diclofenac  Rash    Rash, headache (oral diclofenac ).    Objective:   BP 92/60   Pulse (!) 58   Wt 220 lb (99.8 kg)   BMI 35.51 kg/m  Lying in bed NCAT, EOMI No obvious CN deficits Coloring WNL [place] 'hello' and 'good morning' Not able to answer questions or participate in discussion  Assessment and Plan:   Anxiety- much improved w/ increased dose of Sertraline  and additional Seroquel .  She is not interacting w/ caregiver, allowing husband to do sponge baths and brush her hair, and is at times even willing to run errands.  No med changes at this time.  Neurocognitive d/o- ongoing.  Did miss appts w/  Dr Dohmeier b/c she would not leave the house.  Now that anxiety is better controlled, husband would like to try a video visit w/ Neuro.  Referral placed to try and initiate contact.  She continues to work w/ OT.  No changes at this time.   Comer Greet, MD 09/19/2023

## 2023-09-19 NOTE — Telephone Encounter (Signed)
 Form signed and returned to [facility]

## 2023-09-20 ENCOUNTER — Telehealth: Payer: Self-pay | Admitting: Family Medicine

## 2023-09-20 NOTE — Telephone Encounter (Signed)
 Placed in folder at nurse station

## 2023-09-20 NOTE — Telephone Encounter (Signed)
 Home Health Certification or Plan of Care Tracking  Is this a Certification or Plan of Care? Plan of Care  [facility] Agency: [facility]  Order Number:  [id number]  Has charge sheet been attached? Yes  Where has form been placed:   Labeled & placed in provider bin

## 2023-09-21 NOTE — Telephone Encounter (Signed)
Form signed and returned to [place]

## 2023-09-22 NOTE — Telephone Encounter (Signed)
 Faxed back.

## 2023-09-23 ENCOUNTER — Telehealth: Payer: Self-pay

## 2023-09-23 NOTE — Telephone Encounter (Signed)
 Received forms from [facility] needing provider signature. Form placed in bin at the front desk

## 2023-09-23 NOTE — Telephone Encounter (Signed)
 Obtained forms from front desk  Placed in providers folder for review

## 2023-09-29 NOTE — Telephone Encounter (Signed)
 Faxed and placed in scan bin

## 2023-09-29 NOTE — Telephone Encounter (Signed)
 Form completed and returned to [place]

## 2023-10-04 ENCOUNTER — Telehealth: Admitting: Neurology

## 2023-10-04 ENCOUNTER — Encounter: Payer: Self-pay | Admitting: Neurology

## 2023-10-04 DIAGNOSIS — F02818 Dementia in other diseases classified elsewhere, unspecified severity, with other behavioral disturbance: Secondary | ICD-10-CM | POA: Diagnosis not present

## 2023-10-04 DIAGNOSIS — F801 Expressive language disorder: Secondary | ICD-10-CM | POA: Diagnosis not present

## 2023-10-04 DIAGNOSIS — F0280 Dementia in other diseases classified elsewhere without behavioral disturbance: Secondary | ICD-10-CM

## 2023-10-04 DIAGNOSIS — G3101 Pick's disease: Secondary | ICD-10-CM | POA: Diagnosis not present

## 2023-10-04 DIAGNOSIS — G3109 Other frontotemporal dementia: Secondary | ICD-10-CM | POA: Diagnosis not present

## 2023-10-04 NOTE — Progress Notes (Signed)
 Virtual Visit via Video Note  I connected with Deanna [name] on 10/04/23 at  1:00 PM EDT by a video enabled telemedicine application and verified that I am speaking with the correct person using two identifiers.  Location: Patient: with husband at their home in [place]  Provider: at her office, [facility].    I discussed the limitations of evaluation and management by telemedicine and the availability of in person appointments. The patient expressed understanding and agreed to proceed.  History of Present Illness:  Patient with a 3 year history of primary progressive aphasia.  Decreasing expressive ability also caused increasing frustration, anxiety and withdrawal.  The patient  presented over the past 2 years with progression of : semantic variant primary progressive Aphasia She had been sleepy more often and the [employer] team wanted to obtain a sleep study.  However, many cognitive disorders and dementias are accompanied by changing sleep habits and overall more daytime sleepiness.   She underwent PSG study on 06-06-2022 an attended in lab sleep study : This 63 year-old Female patient has been evaluated for primary progressive aphasia/ semantic aphasia and has reached a significant communication disability. She is described as sleepy in daytime, but did not confirm EDS on the Epworth scale. Due to her communication disability and progressive impairment , she needs to be accompanied by a relative to all clinic visits.  Her son has accompanied her to frequent trips to [place] where she has followed a neurological specialty team for her condition at [facility]. These trips have been more difficult to undergo, and local neurological care was needed.  ADDITIONAL INFORMATION:  The Epworth Sleepiness Scale endorsed at 5 /24 points (scores above or equal to 10 are suggestive of hypersomnolence). FSS endorsed at 15 /63 points.  Height: 66 in Weight: 219 lbs (BMI 35) Neck Size: 16 in   Result : AHI was 8/h overall, Sleep disordered breathing was not present to a clinically relevant degree. The overall AHI was deemed too low to initiate any intervention, only during REM sleep was there a cluster of shallow breathing spells ( HYPOPNEA) but no frank apnea was noted.   This patient slept continuously after midnight and until 4.30 AM without evidence of an underlying organic sleep disorder.     05-26-2022: Last visits: Deanna [name] is a 63 y.o. female patient who is here for revisit 05/26/2022- Dr Tobie is not longer part of the treatment team.  Chief concern according to patient :  Primary Progressive Aphasia followed in [employer] in [place]. She is reporting hypersomnia, but her daytime structure is changing, not able to work, to drive, and she is bored- she may flee into bed - out of lack of anything to do. Her social circle is r eroding. Her husband is working abroad.  Deanna [name] is unable now to speak in whole  sentences, she is anxious, frustrated and repetitive, with a smaller and smaller vocabulary. Uses a Lingraphic tablet.  Sleep study pending full attended sleep study.     Upon referral on 03/30/2022 from PCP for a sleep consultation.  Chief concern according to patient:   Frustrated by prolonged work-up of her symptoms locally, she finally received Dx at [facility] in [place], of : semantic variant primary progressive Aphasia In November 2023,  this patient received her diagnosis of PPA and fronto-temporal domain cognitive changes. The Neurology specialist at [facility] asked for a POLYSOMNOGRAPHY and  that the results would influence her future medication  therapy, mentioning possible use of stimulants such as ritalin,  which he felt was indicated for reward and initiation of verbal clues.  Dr Gershon Needle ,MD , at [facility]. This research neurologist also referred to their genetics department, ST and OT , an only the sleep study is pending.     .    I have the pleasure  of seeing DELITHA [name] 03/30/22 a right -handed female patient accompanied by family member - here to be seen for a possible sleep disorder, .  Her family noted more naps in daytime, not sure about her sleep continuity - she has aphasia related difficulties to describe her sleep to me. It is apparently fragmented sleep with difficulties to re-initiate sleep.  Husband is working in [place] , they live in [place], and had  a medical work up in [place]     Sleep relevant medical history: No Nocturia, Tonsillectomy, Obesity, wisdom teeth extraction. .     Family medical /sleep history: 2 brothers, younger than the patient, both parents alive-  both parents on CPAP.    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  No pets.  Tobacco use- none .  ETOH use ; seldomly,  Caffeine intake in form of Coffee( 1 cup in AM) Soda( /) Tea ( /) or energy drinks Exercise in form of walking.      9-8-2025BETHA [name] 'hello' and 'good morning' Not able to answer questions or participate in discussion   Assessment and Plan:    Anxiety- much improved w/ increased dose of Sertraline  and additional Seroquel .  She is not interacting w/ caregiver, allowing husband to do sponge baths and brush her hair, and is at times even willing to run errands.  No med changes at this time.   Neurocognitive d/o- ongoing.  Did miss appts w/ Dr Chalice b/c she would not leave the house.  Now that anxiety is better controlled, husband would like to try a video visit w/ Neuro.  Referral placed to try and initiate contact.  She continues to work w/ OT.  No changes at this time.Comer Greet, MD, 09/19/2023    Observations/Objective:  Deanna [name] is now no longer able to  perform ADL , onset of this stage in 12-2022.  Her husband retired to take care of her that months. She was becoming at the time highly anxious, refused teeth brushing, help with shower or dressing. Zoloft  had been started at 50 mg in January and was increased in June 2025, Xanax  was added prn and has rarely been used.   She had not showered in months until June when it finally became possible again , until then she also would not leave the home, was highly anxious and resisted entering the car.  Seroquel  was added in June and has helped with mellowing her, her anxiety decreased.   A caregiver was hired to help with personal care and food preparation , now in the 10 th week of service- this worked out well.   She has started to eat smaller portions more frequently, is not tired after these smaller meals.  Occupational therapy visits are alternating with home health nurse visits.  Hairdresser comes to the home now and  she is again well groomed and styled now, seems to enjoy this attention now and not resisting being touched.   She likes to sleep in her clothes and often resists changing over 2-3 days.  Daily outings with husband , often to big box scores , strolling for an hour. Shopping trips have become her social outing and time with her husband.  Other social interactions are limited to 15-20 minutes , after which she feels exhausted.  Her husband noted her best time is around mid day.  She will watch TV , attention enough for short shows, 20-30 minutes.  She uses no longer the picture board for communication.   She is ambulatory and climbs stairs well.   Her coordination, use of silverware is unaffected.  She is not incontinent.   Assessment : expressive primary progressive aphasia, with behaviour changes.    Plan:  I don't want to change any of her medication or their dose.  I encourage routines, time rituals and variable seasonal nutrition components ( fruit and vegetables), in whatever  consistency she likes to eat these ingredients.  Daily activity outdoors.  No stimulant medication at this time, too risky for this patient who has high level anxiety/ panic attacks.  I mentioned Strattera.     Follow Up Instructions:  Rv in 6 months ( virtual Visit) or earlier if needed..     I discussed the assessment and treatment plan with the patient. The patient was provided an opportunity to ask questions and all were answered. The patient agreed with the plan and demonstrated an understanding of the instructions.   The patient was advised to call back or seek an in-person evaluation if the symptoms worsen or if the condition fails to improve as anticipated.  I provided 40 minutes of non-face-to-face time during this encounter.    Dedra Gores, MD

## 2023-10-04 NOTE — Patient Instructions (Signed)
 Assessment : expressive primary progressive aphasia, with behaviour changes.      Plan:   I don't want to change any of her medication or their dose.   I encourage routines, time rituals and variable seasonal nutrition components ( fruit and vegetables), in whatever consistency she likes to eat these ingredients.  Daily activity outdoors.  No stimulant medication at this time, too risky for this patient who has high level anxiety/ panic attacks.       Follow Up Instructions:   Rv in 6 months ( virtual Visit)  .

## 2023-10-05 ENCOUNTER — Telehealth: Payer: Self-pay

## 2023-10-05 NOTE — Telephone Encounter (Addendum)
 Type of form received: [facility] request for signature   Additional comments:   Received by: fax  Form should be Faxed/mailed to: [phone]  Is patient requesting call for pickup: no  Form placed:  in provider sign folder POD A  Attach charge sheet.  Provider will determine charge.  Individual made aware of 3-5 business day turn around No?

## 2023-10-06 ENCOUNTER — Other Ambulatory Visit (HOSPITAL_BASED_OUTPATIENT_CLINIC_OR_DEPARTMENT_OTHER): Payer: Self-pay

## 2023-10-11 ENCOUNTER — Telehealth: Payer: Self-pay | Admitting: Family Medicine

## 2023-10-11 DIAGNOSIS — F801 Expressive language disorder: Secondary | ICD-10-CM

## 2023-10-11 DIAGNOSIS — G3101 Pick's disease: Secondary | ICD-10-CM | POA: Diagnosis not present

## 2023-10-11 DIAGNOSIS — Z6834 Body mass index (BMI) 34.0-34.9, adult: Secondary | ICD-10-CM

## 2023-10-11 DIAGNOSIS — F0282 Dementia in other diseases classified elsewhere, unspecified severity, with psychotic disturbance: Secondary | ICD-10-CM

## 2023-10-11 DIAGNOSIS — F0284 Dementia in other diseases classified elsewhere, unspecified severity, with anxiety: Secondary | ICD-10-CM | POA: Diagnosis not present

## 2023-10-11 DIAGNOSIS — E669 Obesity, unspecified: Secondary | ICD-10-CM

## 2023-10-11 DIAGNOSIS — Z79899 Other long term (current) drug therapy: Secondary | ICD-10-CM

## 2023-10-11 DIAGNOSIS — F02818 Dementia in other diseases classified elsewhere, unspecified severity, with other behavioral disturbance: Secondary | ICD-10-CM | POA: Diagnosis not present

## 2023-10-11 DIAGNOSIS — G4733 Obstructive sleep apnea (adult) (pediatric): Secondary | ICD-10-CM | POA: Diagnosis not present

## 2023-10-11 NOTE — Telephone Encounter (Signed)
 Obtained document from front desk. Placed in providers folder at nurse station to review

## 2023-10-11 NOTE — Telephone Encounter (Signed)
 Home Health Certification or Plan of Care Tracking  Is this a Certification or Plan of Care? Plan of Care  [facility] Agency: [facility]  Order Number:  [id number]  Has charge sheet been attached? Yes  Where has form been placed:   Labeled & placed in provider bin

## 2023-10-17 ENCOUNTER — Other Ambulatory Visit (HOSPITAL_BASED_OUTPATIENT_CLINIC_OR_DEPARTMENT_OTHER): Payer: Self-pay

## 2023-10-21 NOTE — Telephone Encounter (Signed)
 Forms completed and returned to Tonya

## 2023-10-21 NOTE — Telephone Encounter (Signed)
 Form completed and returned to [place]

## 2023-10-24 NOTE — Telephone Encounter (Signed)
 Faxed and placed in scan bin

## 2023-11-16 ENCOUNTER — Telehealth: Payer: Self-pay | Admitting: Family Medicine

## 2023-11-16 NOTE — Telephone Encounter (Signed)
 Home Health Certification or Plan of Care Tracking  Is this a Certification or Plan of Care? Plan of Care  [facility] Agency: [facility]  Order Number:  [id number]  Has charge sheet been attached? Yes  Where has form been placed:   Labeled & placed in provider bin

## 2023-11-16 NOTE — Telephone Encounter (Signed)
 Placed in Dr. Levora folder at nurse station

## 2023-11-16 NOTE — Telephone Encounter (Signed)
 Paperwork completed and placed in fax bin at back nurse station

## 2023-11-17 NOTE — Telephone Encounter (Signed)
 Faxed and placed in scan bin

## 2023-11-17 NOTE — Telephone Encounter (Signed)
 Called [name] and left vm. Dr. Mahlon is out of office until 11/22/13. That is why Dr. Levora signed orders.    Copied from CRM [id number]. Topic: General - Other >> Nov 17, 2023  9:42 AM [name] wrote: Reason for CRM: [name] with [name] called in stating that Dr. Landy signed off on OT orders for patient when these are needing to be signed by Dr. Mahlon Rushing number for [name] is [phone].

## 2023-11-18 ENCOUNTER — Telehealth: Payer: Self-pay | Admitting: Family Medicine

## 2023-11-18 NOTE — Telephone Encounter (Signed)
 Faxed back to the requested number

## 2023-11-18 NOTE — Telephone Encounter (Signed)
 Placed on Dr Oswaldo Done desk

## 2023-11-18 NOTE — Telephone Encounter (Signed)
 Signed and placed in MA basket.  Thank you.

## 2023-11-18 NOTE — Telephone Encounter (Signed)
 Home Health Certification or Plan of Care Tracking  Is this a Certification or Plan of Care? Plan of Care  [facility] Agency: [facility]  Order Number:  [id number]  Has charge sheet been attached? Yes  Where has form been placed:   Labeled & placed in provider bin

## 2023-12-20 ENCOUNTER — Other Ambulatory Visit (HOSPITAL_BASED_OUTPATIENT_CLINIC_OR_DEPARTMENT_OTHER): Payer: Self-pay

## 2023-12-20 ENCOUNTER — Telehealth: Admitting: Family Medicine

## 2023-12-20 DIAGNOSIS — B372 Candidiasis of skin and nail: Secondary | ICD-10-CM

## 2023-12-20 MED ORDER — NYSTATIN 100000 UNIT/GM EX POWD
1.0000 | Freq: Three times a day (TID) | CUTANEOUS | 3 refills | Status: AC
  Filled 2023-12-20: qty 30, 10d supply, fill #0
  Filled 2024-01-09: qty 15, 10d supply, fill #1

## 2023-12-20 NOTE — Progress Notes (Signed)
   Virtual Visit via Video   I connected with patient on 12/20/23 at 10:40 AM EST by a video enabled telemedicine application and verified that I am speaking with the correct person using two identifiers.  Location patient: Home Location provider: [facility], Office Persons participating in the virtual visit: Patient, Provider, CMA (Tonya H)  I discussed the limitations of evaluation and management by telemedicine and the availability of in person appointments. The patient expressed understanding and agreed to proceed.  Subjective:   HPI:   Yeast- yesterday pt's caregiver reported a R underarm odor consistent w/ yeast, 'a sour rancid milk smell'.  Pt is not cooperating w/ bathing or clothing change- only changing clothes once a week.  No visible rash.  ROS:   See pertinent positives and negatives per HPI.  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   Mild obstructive sleep apnea-hypopnea syndrome 08/10/2022   Semantic dementia (HCC) 03/25/2022   Daytime sleepiness 03/25/2022   Obesity (BMI 30-39.9) 03/25/2022   Primary progressive aphasia (HCC) 08/05/2020   Neurocognitive disorder 08/05/2020   Expressive speech disorder 05/23/2020   Acute pain of right knee 12/10/2017   Physical exam 09/16/2017    Social History   Tobacco Use   Smoking status: Never   Smokeless tobacco: Never  Substance Use Topics   Alcohol use: Yes    Alcohol/week: 1.0 standard drink of alcohol    Types: 1 Glasses of wine per week    Comment: Per Week    Current Outpatient Medications:    ALPRAZolam  (XANAX ) 0.5 MG tablet, Take 1 tablet (0.5 mg total) by mouth 2 (two) times daily as needed for anxiety., Disp: 30 tablet, Rfl: 1   OVER THE COUNTER MEDICATION, Balance of nature supplement-three times a day, Disp: , Rfl:    QUEtiapine  (SEROQUEL ) 50 MG tablet, Take 1 tablet (50 mg total) by  mouth at bedtime., Disp: 90 tablet, Rfl: 1   sertraline  (ZOLOFT ) 100 MG tablet, Take 1 tablet (100 mg total) by mouth daily., Disp: 90 tablet, Rfl: 1   QUEtiapine  (SEROQUEL ) 50 MG tablet, Take 1 tablet (50 mg total) by mouth at bedtime., Disp: 30 tablet, Rfl: 3  Allergies  Allergen Reactions   Pollen Extract     Seasonal allergies   Diclofenac  Rash    Rash, headache (oral diclofenac ).    Objective:   There were no vitals taken for this visit. AA, NAD NCAT, EOMI No obvious CN deficits Coloring WNL Non-verbal today but smiles and waves throughout video visit  Assessment and Plan:   Yeast dermatitis- new.  Likely due to hygiene issues.  Stressed importance of keeping skin clean and dry and changing clothes as frequently as possible.  Will start Nystatin  powder.  Reviewed supportive care and red flags that should prompt return.  Husband expressed understanding and agreement.   Comer Greet, MD 12/20/2023

## 2023-12-27 ENCOUNTER — Encounter: Payer: Self-pay | Admitting: Family Medicine

## 2023-12-28 ENCOUNTER — Other Ambulatory Visit (HOSPITAL_BASED_OUTPATIENT_CLINIC_OR_DEPARTMENT_OTHER): Payer: Self-pay

## 2023-12-28 MED ORDER — FLUCONAZOLE 150 MG PO TABS
150.0000 mg | ORAL_TABLET | Freq: Once | ORAL | 0 refills | Status: AC
  Filled 2023-12-28: qty 2, 2d supply, fill #0

## 2023-12-28 NOTE — Telephone Encounter (Signed)
 Caregiver responded

## 2023-12-28 NOTE — Telephone Encounter (Signed)
 Patient has had discharge and caregiver suspects yeast, please advise if there is something we can do without a visit or if one will be necessary

## 2024-01-09 ENCOUNTER — Other Ambulatory Visit (HOSPITAL_BASED_OUTPATIENT_CLINIC_OR_DEPARTMENT_OTHER): Payer: Self-pay

## 2024-01-09 ENCOUNTER — Other Ambulatory Visit: Payer: Self-pay

## 2024-01-09 NOTE — Telephone Encounter (Signed)
 Update from caregiver

## 2024-02-15 ENCOUNTER — Encounter: Payer: Self-pay | Admitting: Family Medicine

## 2024-02-15 ENCOUNTER — Other Ambulatory Visit (HOSPITAL_BASED_OUTPATIENT_CLINIC_OR_DEPARTMENT_OTHER): Payer: Self-pay

## 2024-02-15 ENCOUNTER — Telehealth: Admitting: Family Medicine

## 2024-02-15 MED ORDER — FLUCONAZOLE 150 MG PO TABS
150.0000 mg | ORAL_TABLET | Freq: Once | ORAL | 0 refills | Status: AC
  Filled 2024-02-15 (×2): qty 1, 1d supply, fill #0

## 2024-02-15 NOTE — Progress Notes (Unsigned)
 "   Virtual Visit via Video   I connected with patient on 02/15/24 at  1:20 PM EST by a video enabled telemedicine application and verified that I am speaking with the correct person using two identifiers.  Location patient: Home Location provider: [facility], Office Persons participating in the virtual visit: Patient, Provider, CMA Jethro POUR)  I discussed the limitations of evaluation and management by telemedicine and the availability of in person appointments. The patient expressed understanding and agreed to proceed.  Subjective:   HPI:   Incontinence- first episode on 1/19 which was bladder incontinence.  On 1/24 had bowel incontinence.  Husband noticed that this week she had a very think discharge in the vaginal area of her underwear that was similar to previous yeast infections.  She has had a few additional instances of bowel leakage but these have been contained by the Depends that they started wearing on on 2/1.  ROS:   See pertinent positives and negatives per HPI.  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   Mild obstructive sleep apnea-hypopnea syndrome 08/10/2022   Semantic dementia (HCC) 03/25/2022   Daytime sleepiness 03/25/2022   Obesity (BMI 30-39.9) 03/25/2022   Primary progressive aphasia (HCC) 08/05/2020   Neurocognitive disorder 08/05/2020   Expressive speech disorder 05/23/2020   Acute pain of right knee 12/10/2017   Physical exam 09/16/2017    Social History   Tobacco Use   Smoking status: Never   Smokeless tobacco: Never  Substance Use Topics   Alcohol use: Yes    Alcohol/week: 1.0 standard drink of alcohol    Types: 1 Glasses of wine per week    Comment: Per Week   Current Medications[1]  Allergies[2]  Objective:   Ht 5' 6 (1.676 m)   Wt 210 lb (95.3 kg)   BMI 33.89 kg/m   Patient is well-developed, well-nourished in  no acute distress.  Resting comfortably *** at home.  Head is normocephalic, atraumatic.  No labored breathing.  Speech is clear and coherent with logical content.  Patient is alert and oriented at baseline.  ***  Assessment and Plan:        ***.   Comer Greet, MD 02/15/2024  Time spent with the patient: *** minutes, of which >50% was spent in obtaining information about symptoms, reviewing previous labs, evaluations, and treatments, counseling about condition (please see the discussed topics above), and developing a plan to further investigate it; had a number of questions which I addressed.      [1]  Current Outpatient Medications:    ALPRAZolam  (XANAX ) 0.5 MG tablet, Take 1 tablet (0.5 mg total) by mouth 2 (two) times daily as needed for anxiety., Disp: 30 tablet, Rfl: 1   nystatin  (MYCOSTATIN /NYSTOP ) powder, Apply to affected area(s) 3 (three) times daily., Disp: 60 g, Rfl: 3   OVER THE COUNTER MEDICATION, Balance of nature supplement-three times a day, Disp: , Rfl:    QUEtiapine  (SEROQUEL ) 50 MG tablet, Take 1 tablet (50 mg total) by mouth at bedtime., Disp: 90 tablet, Rfl: 1   sertraline  (ZOLOFT ) 100 MG tablet, Take 1 tablet (100 mg total) by mouth daily., Disp: 90 tablet, Rfl: 1   QUEtiapine  (SEROQUEL ) 50 MG tablet, Take 1 tablet (50 mg total) by mouth at bedtime., Disp: 30 tablet, Rfl: 3 [2]  Allergies Allergen Reactions   Pollen Extract     Seasonal allergies  Diclofenac  Rash    Rash, headache (oral diclofenac ).   "
